# Patient Record
Sex: Female | Born: 1995 | Race: Black or African American | Hispanic: No | Marital: Single | Smoking: Never smoker
Health system: Southern US, Community
[De-identification: ages and names within clinical notes are randomized; demographics above are authoritative.]

## PROBLEM LIST (undated history)

## (undated) DIAGNOSIS — Z87448 Personal history of other diseases of urinary system: Secondary | ICD-10-CM

## (undated) DIAGNOSIS — D649 Anemia, unspecified: Secondary | ICD-10-CM

## (undated) HISTORY — PX: CHOLECYSTECTOMY: SHX55

---
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
## 2015-04-01 ENCOUNTER — Encounter (HOSPITAL_COMMUNITY): Payer: Self-pay | Admitting: Emergency Medicine

## 2015-04-01 ENCOUNTER — Emergency Department (HOSPITAL_COMMUNITY)
Admission: EM | Admit: 2015-04-01 | Discharge: 2015-04-02 | Disposition: A | Discharge status: Home / Self Care | Payer: Medicaid Other | Attending: Emergency Medicine | Admitting: Emergency Medicine

## 2015-04-01 DIAGNOSIS — R339 Retention of urine, unspecified: Secondary | ICD-10-CM

## 2015-04-01 DIAGNOSIS — Z791 Long term (current) use of non-steroidal anti-inflammatories (NSAID): Secondary | ICD-10-CM | POA: Diagnosis not present

## 2015-04-01 DIAGNOSIS — N3000 Acute cystitis without hematuria: Secondary | ICD-10-CM | POA: Insufficient documentation

## 2015-04-01 DIAGNOSIS — Z3202 Encounter for pregnancy test, result negative: Secondary | ICD-10-CM | POA: Insufficient documentation

## 2015-04-01 HISTORY — DX: Personal history of other diseases of urinary system: Z87.448

## 2015-04-01 LAB — URINALYSIS, ROUTINE W REFLEX MICROSCOPIC
Bilirubin Urine: NEGATIVE
Glucose, UA: NEGATIVE mg/dL
KETONES UR: NEGATIVE mg/dL
NITRITE: POSITIVE — AB
PH: 6.5 (ref 5.0–8.0)
Protein, ur: 100 mg/dL — AB
Specific Gravity, Urine: 1.027 (ref 1.005–1.030)
Urobilinogen, UA: 1 mg/dL (ref 0.0–1.0)

## 2015-04-01 LAB — CBC WITH DIFFERENTIAL/PLATELET
BASOS ABS: 0 10*3/uL (ref 0.0–0.1)
Basophils Relative: 0 % (ref 0–1)
EOS ABS: 0.3 10*3/uL (ref 0.0–0.7)
EOS PCT: 2 % (ref 0–5)
HCT: 37.7 % (ref 36.0–46.0)
HEMOGLOBIN: 11.8 g/dL — AB (ref 12.0–15.0)
LYMPHS ABS: 2.4 10*3/uL (ref 0.7–4.0)
Lymphocytes Relative: 20 % (ref 12–46)
MCH: 25.5 pg — AB (ref 26.0–34.0)
MCHC: 31.3 g/dL (ref 30.0–36.0)
MCV: 81.6 fL (ref 78.0–100.0)
MONO ABS: 0.6 10*3/uL (ref 0.1–1.0)
Monocytes Relative: 4 % (ref 3–12)
Neutro Abs: 9.2 10*3/uL — ABNORMAL HIGH (ref 1.7–7.7)
Neutrophils Relative %: 74 % (ref 43–77)
PLATELETS: 298 10*3/uL (ref 150–400)
RBC: 4.62 MIL/uL (ref 3.87–5.11)
RDW: 15.3 % (ref 11.5–15.5)
WBC: 12.5 10*3/uL — ABNORMAL HIGH (ref 4.0–10.5)

## 2015-04-01 LAB — BASIC METABOLIC PANEL
Anion gap: 9 (ref 5–15)
BUN: 6 mg/dL (ref 6–23)
CALCIUM: 9.6 mg/dL (ref 8.4–10.5)
CHLORIDE: 107 mmol/L (ref 96–112)
CO2: 25 mmol/L (ref 19–32)
CREATININE: 0.75 mg/dL (ref 0.50–1.10)
GFR calc Af Amer: >90 mL/min (ref >90)
Glucose, Bld: 105 mg/dL — ABNORMAL HIGH (ref 70–99)
Potassium: 3.7 mmol/L (ref 3.5–5.1)
SODIUM: 141 mmol/L (ref 135–145)

## 2015-04-01 LAB — URINE MICROSCOPIC-ADD ON

## 2015-04-01 LAB — PREGNANCY, URINE: Preg Test, Ur: NEGATIVE

## 2015-04-01 MED ORDER — KETOROLAC TROMETHAMINE 60 MG/2ML IM SOLN
60.0000 mg | Freq: Once | INTRAMUSCULAR | Status: AC
  Administered 2015-04-01: 60 mg via INTRAMUSCULAR
  Filled 2015-04-01: qty 2

## 2015-04-01 MED ORDER — OXYCODONE-ACETAMINOPHEN 5-325 MG PO TABS
1.0000 | ORAL_TABLET | Freq: Once | ORAL | Status: AC
  Administered 2015-04-01: 1 via ORAL
  Filled 2015-04-01: qty 1

## 2015-04-01 MED ORDER — CEFTRIAXONE SODIUM 1 G IJ SOLR
1.0000 g | INTRAMUSCULAR | Status: DC
  Administered 2015-04-01: 1 g via INTRAMUSCULAR
  Filled 2015-04-01: qty 10

## 2015-04-01 MED ORDER — ONDANSETRON HCL 4 MG PO TABS: 4.0000 mg | ORAL_TABLET | Freq: Four times a day (QID) | ORAL | Status: DC

## 2015-04-01 MED ORDER — ONDANSETRON 4 MG PO TBDP
4.0000 mg | ORAL_TABLET | Freq: Once | ORAL | Status: AC
  Administered 2015-04-01: 4 mg via ORAL
  Filled 2015-04-01: qty 1

## 2015-04-01 MED ORDER — LIDOCAINE HCL 1 % IJ SOLN
INTRAMUSCULAR | Status: AC
  Filled 2015-04-01: qty 20

## 2015-04-01 MED ORDER — CIPROFLOXACIN HCL 500 MG PO TABS: 500.0000 mg | ORAL_TABLET | Freq: Two times a day (BID) | ORAL | Status: DC

## 2015-04-01 NOTE — ED Notes (Signed)
Witnessed in and out catheter with NT Cassidy Kirk

## 2015-04-01 NOTE — ED Provider Notes (Signed)
CSN: [id number]     Arrival date & time 04/01/15  2013 History   First MD Initiated Contact with Patient 04/01/15 2113     Chief Complaint  Patient presents with  . urinary problems      (Consider location/radiation/quality/duration/timing/severity/associated sxs/prior Treatment) HPI    PCP: No primary care provider on file. Blood pressure 136/73, pulse 79, temperature 98.7 F (37.1 C), temperature source Oral, resp. rate 16, height  (1.702 m), weight 170 lb 4 oz (77.225 kg), last menstrual period 04/01/2015, SpO2 100 %.  Arliene Rosenow is a 19 y.o.female with a significant PMH of urethra dysfunction (unspecified) presents to the ER with complaints of being unable to urinate. Her dad who is here with her reports her having this problem as a child and a cystogram was done that showed her urinary sphincter did not function correctly. She required many in-and-out catheterizations but eventually grew out of it. Since yesterday she has developed urinary hesitancy and now is unable to urinate today except for very few drips. She is also having pain and suprapubic discomfort. She started her menstrual cycle today as well.  Negative Review of Symptoms: No nausea, vomiting, diarrhea, fevers, back pain, CP, SOB, LE swelling, hematuria, dysuria, injury to the abdomen or back.   Past Medical History  Diagnosis Date  . H/O bladder problems    History reviewed. No pertinent past surgical history. Family History  Problem Relation Age of Onset  . Heart attack Father 19  . Sudden death Neg Hx    History  Substance Use Topics  . Smoking status: Never Smoker   . Smokeless tobacco: Not on file  . Alcohol Use: No   OB History    No data available     Review of Systems  10 Systems reviewed and are negative for acute change except as noted in the HPI.   Allergies  Review of patient's allergies indicates no known allergies.  Home Medications   Prior to Admission medications    Medication Sig Start Date End Date Taking? Authorizing Provider  ibuprofen (ADVIL,MOTRIN) 800 MG tablet Take 1 tablet (800 mg total) by mouth 3 (three) times daily. 06/05/13  Yes Glynn Octave, MD   BP 112/72 mmHg  Pulse 96  Temp(Src) 98.2 F (36.8 C) (Oral)  Resp 18  Ht  (1.702 m)  Wt 170 lb 4 oz (77.225 kg)  BMI 26.66 kg/m2  SpO2 100%  LMP 04/01/2015 (Exact Date) Physical Exam  Constitutional: She appears well-developed and well-nourished. No distress.  HENT:  Head: Normocephalic and atraumatic.  Eyes: Pupils are equal, round, and reactive to light.  Neck: Normal range of motion. Neck supple.  Cardiovascular: Normal rate and regular rhythm.   Pulmonary/Chest: Effort normal.  Abdominal: Soft. Bowel sounds are normal. She exhibits no distension. There is tenderness in the suprapubic area. There is no rigidity, no rebound, no guarding and no CVA tenderness.  Neurological: She is alert.  Skin: Skin is warm and dry.  Nursing note and vitals reviewed.     ED Course  Procedures (including critical care time) Labs Review Labs Reviewed  URINALYSIS, ROUTINE W REFLEX MICROSCOPIC - Abnormal; Notable for the following:    Color, Urine AMBER (*)    APPearance TURBID (*)    Hgb urine dipstick LARGE (*)    Protein, ur 100 (*)    Nitrite POSITIVE (*)    Leukocytes, UA LARGE (*)    All other components within normal limits  CBC WITH DIFFERENTIAL/PLATELET -  Abnormal; Notable for the following:    WBC 12.5 (*)    Hemoglobin 11.8 (*)    MCH 25.5 (*)    Neutro Abs 9.2 (*)    All other components within normal limits  BASIC METABOLIC PANEL - Abnormal; Notable for the following:    Glucose, Bld 105 (*)    All other components within normal limits  URINE MICROSCOPIC-ADD ON - Abnormal; Notable for the following:    Bacteria, UA MANY (*)    All other components within normal limits  PREGNANCY, URINE    Imaging Review No results found.   EKG Interpretation None      MDM    Final diagnoses:  Acute cystitis without hematuria  Urinary retention    Patient has large UTI, discussed PO, IM vs IV medications for treatment. She preferred to have IM abx and antiinflammatory medications. Patient had 60 ml in her bladder and this was removed by in-out cath. Discussed with patient that the medications should help to decrease inflammation and increase flow, however, that she may end up needing to come back tomorrow to be cath'd until more inflammation resolved. Will also give referral to Urology. Now that he bladder has been drained she does not have any more discomfort.  CBC and BMP are reassuring.  19 y.o.Jamea Niebla's evaluation in the Emergency Department is complete. It has been determined that no acute conditions requiring further emergency intervention are present at this time. The patient/guardian have been advised of the diagnosis and plan. We have discussed signs and symptoms that warrant return to the ED, such as changes or worsening in symptoms.  Vital signs are stable at discharge. Filed Vitals:   04/01/15 2312  BP: 112/72  Pulse: 96  Temp: 98.2 F (36.8 C)  Resp: 18    Patient/guardian has voiced understanding and agreed to follow-up with the PCP or specialist.     Marlon Peliffany Shawnie Nicole, PA-C 04/01/15 [id number]  Mancel BaleElliott Wentz, MD 04/03/15 1402

## 2015-04-01 NOTE — Discharge Instructions (Signed)
Urinary Tract Infection Urinary tract infections (UTIs) can develop anywhere along your urinary tract. Your urinary tract is your body's drainage system for removing wastes and extra water. Your urinary tract includes two kidneys, two ureters, a bladder, and a urethra. Your kidneys are a pair of bean-shaped organs. Each kidney is about the size of your fist. They are located below your ribs, one on each side of your spine. CAUSES Infections are caused by microbes, which are microscopic organisms, including fungi, viruses, and bacteria. These organisms are so small that they can only be seen through a microscope. Bacteria are the microbes that most commonly cause UTIs. SYMPTOMS  Symptoms of UTIs may vary by age and gender of the patient and by the location of the infection. Symptoms in young women typically include a frequent and intense urge to urinate and a painful, burning feeling in the bladder or urethra during urination. Older women and men are more likely to be tired, shaky, and weak and have muscle aches and abdominal pain. A fever may mean the infection is in your kidneys. Other symptoms of a kidney infection include pain in your back or sides below the ribs, nausea, and vomiting. DIAGNOSIS To diagnose a UTI, your caregiver will ask you about your symptoms. Your caregiver also will ask to provide a urine sample. The urine sample will be tested for bacteria and white blood cells. White blood cells are made by your body to help fight infection. TREATMENT  Typically, UTIs can be treated with medication. Because most UTIs are caused by a bacterial infection, they usually can be treated with the use of antibiotics. The choice of antibiotic and length of treatment depend on your symptoms and the type of bacteria causing your infection. HOME CARE INSTRUCTIONS  If you were prescribed antibiotics, take them exactly as your caregiver instructs you. Finish the medication even if you feel better after you  have only taken some of the medication.  Drink enough water and fluids to keep your urine clear or pale yellow.  Avoid caffeine, tea, and carbonated beverages. They tend to irritate your bladder.  Empty your bladder often. Avoid holding urine for long periods of time.  Empty your bladder before and after sexual intercourse.  After a bowel movement, women should cleanse from front to back. Use each tissue only once. SEEK MEDICAL CARE IF:   You have back pain.  You develop a fever.  Your symptoms do not begin to resolve within 3 days. SEEK IMMEDIATE MEDICAL CARE IF:   You have severe back pain or lower abdominal pain.  You develop chills.  You have nausea or vomiting.  You have continued burning or discomfort with urination. MAKE SURE YOU:   Understand these instructions.  Will watch your condition.  Will get help right away if you are not doing well or get worse. Document Released: 09/01/2005 Document Revised: 05/23/2012 Document Reviewed: 12/31/2011 [facility] Patient Information 2015 [place], [place]. This information is not intended to replace advice given to you by your health care provider. Make sure you discuss any questions you have with your health care provider.  Acute Urinary Retention Acute urinary retention is the temporary inability to urinate. This is an uncommon problem in women. It can be caused by:  Infection.  A side effect of a medicine.  A problem in a nearby organ that presses or squeezes on the bladder or the urethra (the tube that drains the bladder).  Psychological problems.   Surgery on your bladder, urethra,  or pelvic organs that causes obstruction to the outflow of urine from your bladder. HOME CARE INSTRUCTIONS  If you are sent home with a Foley catheter and a drainage system, you will need to discuss the best course of action with your health care provider. While the catheter is in, maintain a good intake of fluids. Keep the drainage bag  emptied and lower than your catheter. This is so that contaminated urine will not flow back into your bladder, which could lead to a urinary tract infection. There are two main types of drainage bags. One is a large bag that usually is used at night. It has a good capacity that will allow you to sleep through the night without having to empty it. The second type is called a leg bag. It has a smaller capacity so it needs to be emptied more frequently. However, the main advantage is that it can be attached by a leg strap and goes underneath your clothing, allowing you the freedom to move about or leave your home. Only take over-the-counter or prescription medicines for pain, discomfort, or fever as directed by your health care provider.  SEEK MEDICAL CARE IF:  You develop a low-grade fever.  You experience spasms or leakage of urine with the spasms. SEEK IMMEDIATE MEDICAL CARE IF:   You develop chills or fever.  Your catheter stops draining urine.  Your catheter falls out.  You start to develop increased bleeding that does not respond to rest and increased fluid intake. MAKE SURE YOU:  Understand these instructions.  Will watch your condition.  Will get help right away if you are not doing well or get worse. Document Released: 11/21/2006 Document Revised: 09/12/2013 Document Reviewed: 05/03/2013 [facility] Patient Information 2015 [place], [place]. This information is not intended to replace advice given to you by your health care provider. Make sure you discuss any questions you have with your health care provider.

## 2015-04-01 NOTE — ED Notes (Signed)
Pt states for the past two days she has been having difficulty emptying her bladder  Pt states she is having to force herself to void  Pt states she had problems like this when she was younger and had to have a catheter  Pt states today she is completely unable to get anything to come out  Pt states she has a lot of pressure in her pelvic area  Pt states she started her period today as well

## 2015-04-04 LAB — URINE CULTURE

## 2015-04-09 ENCOUNTER — Telehealth (HOSPITAL_BASED_OUTPATIENT_CLINIC_OR_DEPARTMENT_OTHER): Payer: Self-pay | Admitting: Emergency Medicine

## 2015-04-09 NOTE — Telephone Encounter (Signed)
Post ED Visit - Positive Culture Follow-up  Culture report reviewed by antimicrobial stewardship pharmacist: []  Wes Dulaney, Pharm.D., BCPS [x]  Celedonio MiyamotoJeremy Frens, Pharm.D., BCPS []  Georgina PillionElizabeth Martin, Pharm.D., BCPS []  PomonaMinh Pham, [address]Pharm[address]D., BCPS, AAHIVP []  Estella HuskMichelle Turner, Pharm.D., BCPS, AAHIVP []  Elder CyphersLorie Poole, [address]Pharm.D., BCPS  Positive urine culture E. coli Treated with ciprofloxacin, organism sensitive to the same and no further patient follow-up is required at this time.  Berle MullMiller, Becky Colan 04/09/2015, 11:53 AM

## 2015-04-29 ENCOUNTER — Emergency Department (HOSPITAL_COMMUNITY)
Admission: EM | Admit: 2015-04-29 | Discharge: 2015-04-29 | Disposition: A | Discharge status: Home / Self Care | Payer: Medicaid Other | Attending: Emergency Medicine | Admitting: Emergency Medicine

## 2015-04-29 ENCOUNTER — Encounter (HOSPITAL_COMMUNITY): Payer: Self-pay | Admitting: Emergency Medicine

## 2015-04-29 DIAGNOSIS — Z791 Long term (current) use of non-steroidal anti-inflammatories (NSAID): Secondary | ICD-10-CM | POA: Insufficient documentation

## 2015-04-29 DIAGNOSIS — Z3202 Encounter for pregnancy test, result negative: Secondary | ICD-10-CM | POA: Insufficient documentation

## 2015-04-29 DIAGNOSIS — N946 Dysmenorrhea, unspecified: Secondary | ICD-10-CM | POA: Insufficient documentation

## 2015-04-29 DIAGNOSIS — Z792 Long term (current) use of antibiotics: Secondary | ICD-10-CM | POA: Insufficient documentation

## 2015-04-29 LAB — I-STAT CHEM 8, ED
BUN: 5 mg/dL — AB (ref 6–20)
CREATININE: 0.7 mg/dL (ref 0.44–1.00)
Calcium, Ion: 1.25 mmol/L — ABNORMAL HIGH (ref 1.12–1.23)
Chloride: 105 mmol/L (ref 101–111)
GLUCOSE: 94 mg/dL (ref 65–99)
HCT: 40 % (ref 36.0–46.0)
Hemoglobin: 13.6 g/dL (ref 12.0–15.0)
POTASSIUM: 3.7 mmol/L (ref 3.5–5.1)
Sodium: 140 mmol/L (ref 135–145)
TCO2: 20 mmol/L (ref 0–100)

## 2015-04-29 LAB — URINALYSIS, ROUTINE W REFLEX MICROSCOPIC
BILIRUBIN URINE: NEGATIVE
GLUCOSE, UA: NEGATIVE mg/dL
KETONES UR: NEGATIVE mg/dL
Nitrite: NEGATIVE
Protein, ur: NEGATIVE mg/dL
Specific Gravity, Urine: 1.023 (ref 1.005–1.030)
UROBILINOGEN UA: 1 mg/dL (ref 0.0–1.0)
pH: 7.5 (ref 5.0–8.0)

## 2015-04-29 LAB — CBC WITH DIFFERENTIAL/PLATELET
BASOS ABS: 0 10*3/uL (ref 0.0–0.1)
BASOS PCT: 0 % (ref 0–1)
EOS ABS: 0.2 10*3/uL (ref 0.0–0.7)
Eosinophils Relative: 2 % (ref 0–5)
HEMATOCRIT: 38.1 % (ref 36.0–46.0)
Hemoglobin: 12 g/dL (ref 12.0–15.0)
LYMPHS ABS: 1.7 10*3/uL (ref 0.7–4.0)
LYMPHS PCT: 14 % (ref 12–46)
MCH: 25.7 pg — AB (ref 26.0–34.0)
MCHC: 31.5 g/dL (ref 30.0–36.0)
MCV: 81.6 fL (ref 78.0–100.0)
MONOS PCT: 4 % (ref 3–12)
Monocytes Absolute: 0.6 10*3/uL (ref 0.1–1.0)
NEUTROS PCT: 80 % — AB (ref 43–77)
Neutro Abs: 10 10*3/uL — ABNORMAL HIGH (ref 1.7–7.7)
Platelets: 299 10*3/uL (ref 150–400)
RBC: 4.67 MIL/uL (ref 3.87–5.11)
RDW: 15.4 % (ref 11.5–15.5)
WBC: 12.5 10*3/uL — AB (ref 4.0–10.5)

## 2015-04-29 LAB — URINE MICROSCOPIC-ADD ON

## 2015-04-29 LAB — WET PREP, GENITAL
TRICH WET PREP: NONE SEEN
Yeast Wet Prep HPF POC: NONE SEEN

## 2015-04-29 LAB — PREGNANCY, URINE: Preg Test, Ur: NEGATIVE

## 2015-04-29 MED ORDER — SODIUM CHLORIDE 0.9 % IV BOLUS (SEPSIS)
1000.0000 mL | Freq: Once | INTRAVENOUS | Status: AC
  Administered 2015-04-29: 1000 mL via INTRAVENOUS

## 2015-04-29 MED ORDER — NAPROXEN 500 MG PO TABS: 500.0000 mg | ORAL_TABLET | Freq: Two times a day (BID) | ORAL | Status: DC

## 2015-04-29 MED ORDER — ONDANSETRON HCL 4 MG/2ML IJ SOLN
4.0000 mg | Freq: Once | INTRAMUSCULAR | Status: AC
  Administered 2015-04-29: 4 mg via INTRAVENOUS
  Filled 2015-04-29: qty 2

## 2015-04-29 NOTE — ED Notes (Addendum)
Per EMS. Pt stopped birth control 2 weeks ago. Pt was formerly on birth control for irregular periods with heavy bleeding. Pt reports heavy vaginal bleeding, lower back pain and lightheadedness. Has tried ibuprofen and midol with no relief. Pt ambulatory to room. Bleeding started this am along with emesis.

## 2015-04-29 NOTE — Progress Notes (Signed)
pcp is [facility] [address] [place], [place] [id number] [phone]  

## 2015-04-29 NOTE — ED Provider Notes (Signed)
CSN: [id number]     Arrival date & time 04/29/15  1202 History   First MD Initiated Contact with Patient 04/29/15 1207     Chief Complaint  Patient presents with  . Dysmenorrhea     (Consider location/radiation/quality/duration/timing/severity/associated sxs/prior Treatment) HPI Comments: Pt comes in today with menstrual cramping, heavy bleeding and dizziness. Pt states that she has similar history of heavy bleeding and she was on birth control for that and she stopped taking that 2 weeks ago. She tried ibuprofen with mild relief. Has nausea and diarrhea which pt states is also consistent with her menstrual cycle  The history is provided by the patient. No language interpreter was used.    Past Medical History  Diagnosis Date  . H/O bladder problems    History reviewed. No pertinent past surgical history. Family History  Problem Relation Age of Onset  . Heart attack Father 4027  . Sudden death Neg Hx    History  Substance Use Topics  . Smoking status: Never Smoker   . Smokeless tobacco: Not on file  . Alcohol Use: No   OB History    No data available     Review of Systems  All other systems reviewed and are negative.     Allergies  Review of patient's allergies indicates no known allergies.  Home Medications   Prior to Admission medications   Medication Sig Start Date End Date Taking? Authorizing Provider  ibuprofen (ADVIL,MOTRIN) 200 MG tablet Take 800 mg by mouth every 6 (six) hours as needed for moderate pain or cramping.   Yes Historical Provider, MD  ciprofloxacin (CIPRO) 500 MG tablet Take 1 tablet (500 mg total) by mouth 2 (two) times daily. Patient not taking: Reported on 04/29/2015 04/01/15   Marlon Peliffany Greene, PA-C  ibuprofen (ADVIL,MOTRIN) 800 MG tablet Take 1 tablet (800 mg total) by mouth 3 (three) times daily. Patient not taking: Reported on 04/29/2015 06/05/13   Glynn OctaveStephen Rancour, MD  ondansetron (ZOFRAN) 4 MG tablet Take 1 tablet (4 mg total) by mouth every 6  (six) hours. Patient not taking: Reported on 04/29/2015 04/01/15   Marlon Peliffany Greene, PA-C   BP 114/66 mmHg  Pulse 55  Temp(Src) 98.1 F (36.7 C) (Oral)  Resp 19  SpO2 100%  LMP 04/01/2015 (Exact Date) Physical Exam  Constitutional: She is oriented to person, place, and time. She appears well-developed and well-nourished.  Cardiovascular: Normal rate and regular rhythm.   Pulmonary/Chest: Effort normal and breath sounds normal.  Abdominal: Soft. Bowel sounds are normal. There is no tenderness.  Genitourinary:  -cmt. No adnexal tenderness. Mild vaginal bleeding  Musculoskeletal: Normal range of motion.  Neurological: She is alert and oriented to person, place, and time.  Skin: Skin is warm and dry.  Psychiatric: She has a normal mood and affect.  Nursing note and vitals reviewed.   ED Course  Procedures (including critical care time) Labs Review Labs Reviewed  WET PREP, GENITAL - Abnormal; Notable for the following:    Clue Cells Wet Prep HPF POC FEW (*)    WBC, Wet Prep HPF POC MODERATE (*)    All other components within normal limits  URINALYSIS, ROUTINE W REFLEX MICROSCOPIC - Abnormal; Notable for the following:    APPearance TURBID (*)    Hgb urine dipstick LARGE (*)    Leukocytes, UA TRACE (*)    All other components within normal limits  CBC WITH DIFFERENTIAL/PLATELET - Abnormal; Notable for the following:    WBC 12.5 (*)  MCH 25.7 (*)    Neutrophils Relative % 80 (*)    Neutro Abs 10.0 (*)    All other components within normal limits  URINE MICROSCOPIC-ADD ON - Abnormal; Notable for the following:    Squamous Epithelial / LPF FEW (*)    Bacteria, UA FEW (*)    All other components within normal limits  I-STAT CHEM 8, ED - Abnormal; Notable for the following:    BUN 5 (*)    Calcium, Ion 1.25 (*)    All other components within normal limits  PREGNANCY, URINE  HIV ANTIBODY (ROUTINE TESTING)  GC/CHLAMYDIA PROBE AMP ([place])    Imaging Review No results  found.   EKG Interpretation None      MDM   Final diagnoses:  Dysmenorrhea  Menstrual cramps    hgb normal. Pt feeling better after some fluids. Will send home with naproxen. Discussed with pt that starting birth control again is a good idea. She plans to do that again this week    Teressa Lower, NP 04/29/15 1523  Benjiman Core, MD 04/29/15 [phone]

## 2015-04-29 NOTE — Discharge Instructions (Signed)

## 2015-04-29 NOTE — ED Notes (Signed)
Bed: [id number] Expected date:  Expected time:  Means of arrival:  Comments: EMS- 18yo F, menstrual cramps

## 2015-04-30 LAB — HIV ANTIBODY (ROUTINE TESTING W REFLEX): HIV Screen 4th Generation wRfx: NONREACTIVE

## 2015-04-30 LAB — GC/CHLAMYDIA PROBE AMP (~~LOC~~) NOT AT ARMC
Chlamydia: NEGATIVE
Neisseria Gonorrhea: NEGATIVE

## 2015-12-06 ENCOUNTER — Emergency Department (HOSPITAL_COMMUNITY)
Admission: EM | Admit: 2015-12-06 | Discharge: 2015-12-06 | Disposition: A | Discharge status: Home / Self Care | Payer: Medicaid Other | Attending: Emergency Medicine | Admitting: Emergency Medicine

## 2015-12-06 ENCOUNTER — Encounter (HOSPITAL_COMMUNITY): Payer: Self-pay | Admitting: *Deleted

## 2015-12-06 ENCOUNTER — Emergency Department (HOSPITAL_COMMUNITY): Payer: Medicaid Other

## 2015-12-06 DIAGNOSIS — Z862 Personal history of diseases of the blood and blood-forming organs and certain disorders involving the immune mechanism: Secondary | ICD-10-CM | POA: Diagnosis not present

## 2015-12-06 DIAGNOSIS — N72 Inflammatory disease of cervix uteri: Secondary | ICD-10-CM | POA: Diagnosis not present

## 2015-12-06 DIAGNOSIS — Z3202 Encounter for pregnancy test, result negative: Secondary | ICD-10-CM | POA: Diagnosis not present

## 2015-12-06 DIAGNOSIS — R102 Pelvic and perineal pain: Secondary | ICD-10-CM

## 2015-12-06 HISTORY — DX: Anemia, unspecified: D64.9

## 2015-12-06 LAB — URINALYSIS, ROUTINE W REFLEX MICROSCOPIC
BILIRUBIN URINE: NEGATIVE
GLUCOSE, UA: NEGATIVE mg/dL
Hgb urine dipstick: NEGATIVE
KETONES UR: NEGATIVE mg/dL
Nitrite: NEGATIVE
PROTEIN: NEGATIVE mg/dL
Specific Gravity, Urine: 1.02 (ref 1.005–1.030)
pH: 7.5 (ref 5.0–8.0)

## 2015-12-06 LAB — URINE MICROSCOPIC-ADD ON

## 2015-12-06 LAB — WET PREP, GENITAL
Clue Cells Wet Prep HPF POC: NONE SEEN
Sperm: NONE SEEN
Trich, Wet Prep: NONE SEEN
Yeast Wet Prep HPF POC: NONE SEEN

## 2015-12-06 LAB — POC URINE PREG, ED: PREG TEST UR: NEGATIVE

## 2015-12-06 MED ORDER — DEXTROSE 5 % IV SOLN
1.0000 g | Freq: Once | INTRAVENOUS | Status: AC
  Administered 2015-12-06: 1 g via INTRAVENOUS
  Filled 2015-12-06: qty 10

## 2015-12-06 MED ORDER — HYDROCODONE-ACETAMINOPHEN 5-325 MG PO TABS: 2.0000 | ORAL_TABLET | ORAL | Status: DC | PRN

## 2015-12-06 MED ORDER — IBUPROFEN 600 MG PO TABS: 600.0000 mg | ORAL_TABLET | Freq: Four times a day (QID) | ORAL | Status: DC | PRN

## 2015-12-06 MED ORDER — DOXYCYCLINE HYCLATE 100 MG PO CAPS: 100.0000 mg | ORAL_CAPSULE | Freq: Two times a day (BID) | ORAL | Status: DC

## 2015-12-06 MED ORDER — HYDROCODONE-ACETAMINOPHEN 5-325 MG PO TABS
2.0000 | ORAL_TABLET | Freq: Once | ORAL | Status: AC
  Administered 2015-12-06: 2 via ORAL
  Filled 2015-12-06: qty 2

## 2015-12-06 NOTE — ED Provider Notes (Signed)
CSN: [id number]     Arrival date & time 12/06/15  1528 History   First MD Initiated Contact with Patient 12/06/15 1718     Chief Complaint  Patient presents with  . Vaginal Pain     (Consider location/radiation/quality/duration/timing/severity/associated sxs/prior Treatment) HPI Patient presents with one week of vaginal swelling and irritation. She also admits to dysuria. She states she had protected sex on 12/24. Symptoms began at that point. Denies any nausea or vomiting. No fever or chills. Denies vaginal discharge or bleeding.  Past Medical History  Diagnosis Date  . H/O bladder problems   . Anemia    History reviewed. No pertinent past surgical history. Family History  Problem Relation Age of Onset  . Heart attack Father 62  . Sudden death Neg Hx    Social History  Substance Use Topics  . Smoking status: Never Smoker   . Smokeless tobacco: None  . Alcohol Use: Yes     Comment: occ   OB History    No data available     Review of Systems  Constitutional: Negative for fever and chills.  Respiratory: Negative for shortness of breath.   Cardiovascular: Negative for chest pain.  Gastrointestinal: Negative for nausea, vomiting, abdominal pain and diarrhea.  Genitourinary: Positive for dysuria and vaginal pain. Negative for frequency, flank pain, vaginal bleeding, vaginal discharge and difficulty urinating.  Musculoskeletal: Negative for myalgias, back pain and neck pain.  Skin: Negative for rash and wound.  Neurological: Negative for dizziness, weakness, light-headedness, numbness and headaches.  All other systems reviewed and are negative.     Allergies  Review of patient's allergies indicates no known allergies.  Home Medications   Prior to Admission medications   Medication Sig Start Date End Date Taking? Authorizing Provider  ciprofloxacin (CIPRO) 500 MG tablet Take 1 tablet (500 mg total) by mouth 2 (two) times daily. Patient not taking: Reported on  04/29/2015 04/01/15   Marlon Pel, PA-C  doxycycline (VIBRAMYCIN) 100 MG capsule Take 1 capsule (100 mg total) by mouth 2 (two) times daily. One po bid x 7 days 12/06/15   Loren Racer, MD  HYDROcodone-acetaminophen [facility]) 5-325 MG tablet Take 2 tablets by mouth every 4 (four) hours as needed. 12/06/15   Loren Racer, MD  ibuprofen (ADVIL,MOTRIN) 600 MG tablet Take 1 tablet (600 mg total) by mouth every 6 (six) hours as needed. 12/06/15   Loren Racer, MD  naproxen (NAPROSYN) 500 MG tablet Take 1 tablet (500 mg total) by mouth 2 (two) times daily. Patient not taking: Reported on 12/06/2015 04/29/15   Teressa Lower, NP  ondansetron (ZOFRAN) 4 MG tablet Take 1 tablet (4 mg total) by mouth every 6 (six) hours. Patient not taking: Reported on 04/29/2015 04/01/15   Marlon Pel, PA-C   BP 110/64 mmHg  Pulse 62  Temp(Src) 98.2 F (36.8 C) (Oral)  Resp 16  Ht  (1.702 m)  Wt 170 lb (77.111 kg)  BMI 26.62 kg/m2  SpO2 100%  LMP 11/19/2015 Physical Exam  Constitutional: She is oriented to person, place, and time. She appears well-developed and well-nourished. No distress.  HENT:  Head: Normocephalic and atraumatic.  Mouth/Throat: Oropharynx is clear and moist.  Eyes: EOM are normal. Pupils are equal, round, and reactive to light.  Neck: Normal range of motion. Neck supple.  Cardiovascular: Normal rate and regular rhythm.   Pulmonary/Chest: Effort normal and breath sounds normal. No respiratory distress. She has no wheezes. She has no rales.  Abdominal: Soft. Bowel sounds  are normal. She exhibits no distension and no mass. There is no tenderness. There is no rebound and no guarding.  Genitourinary: Vaginal discharge found.  Painful speculum exam. Copious Amounts of Purulent Discharge. External Genitalia Appears Normal. No Masses Appreciated. Unable to do a bimanual exam due to discomfort.  Musculoskeletal: Normal range of motion. She exhibits no edema or tenderness.  No CVA  tenderness bilaterally.  Neurological: She is alert and oriented to person, place, and time.  Moves all extremities without deficit. Sensation grossly intact.  Skin: Skin is warm and dry. No rash noted. No erythema.  Psychiatric: She has a normal mood and affect. Her behavior is normal.  Nursing note and vitals reviewed.   ED Course  Procedures (including critical care time) Labs Review Labs Reviewed  WET PREP, GENITAL - Abnormal; Notable for the following:    WBC, Wet Prep HPF POC MANY (*)    All other components within normal limits  URINALYSIS, ROUTINE W REFLEX MICROSCOPIC (NOT AT [facility]) - Abnormal; Notable for the following:    APPearance CLOUDY (*)    Leukocytes, UA MODERATE (*)    All other components within normal limits  URINE MICROSCOPIC-ADD ON - Abnormal; Notable for the following:    Squamous Epithelial / LPF 6-30 (*)    Bacteria, UA FEW (*)    All other components within normal limits  POC URINE PREG, ED  GC/CHLAMYDIA PROBE AMP ([place]) NOT AT [facility]    Imaging Review [place] Transvaginal Non-ob  12/06/2015  CLINICAL DATA:  Patient with vaginal swelling and discharge. EXAM: TRANSABDOMINAL AND TRANSVAGINAL ULTRASOUND OF PELVIS TECHNIQUE: Both transabdominal and transvaginal ultrasound examinations of the pelvis were performed. Transabdominal technique was performed for global imaging of the pelvis including uterus, ovaries, adnexal regions, and pelvic cul-de-sac. It was necessary to proceed with endovaginal exam following the transabdominal exam to visualize the adnexal structures. COMPARISON:  None FINDINGS: Uterus Measurements: 7.3 x 3.6 x 4.8 cm. Retroverted. No fibroids or other mass visualized. Endometrium Thickness: 15 mm.  No focal abnormality visualized. Right ovary Measurements: 6.1 x 2.3 x 3.0 cm. Normal appearance/no adnexal mass. Left ovary Measurements: 5.3 x 2.1 x 2.6 cm. Normal appearance/no adnexal mass. Other findings Small amount of free fluid in the pelvis.  IMPRESSION: Unremarkable pelvic ultrasound. Electronically Signed   By: Annia Beltrew  Davis M.D.   On: 12/06/2015 21:57   [place] Pelvis Complete  12/06/2015  CLINICAL DATA:  Patient with vaginal swelling and discharge. EXAM: TRANSABDOMINAL AND TRANSVAGINAL ULTRASOUND OF PELVIS TECHNIQUE: Both transabdominal and transvaginal ultrasound examinations of the pelvis were performed. Transabdominal technique was performed for global imaging of the pelvis including uterus, ovaries, adnexal regions, and pelvic cul-de-sac. It was necessary to proceed with endovaginal exam following the transabdominal exam to visualize the adnexal structures. COMPARISON:  None FINDINGS: Uterus Measurements: 7.3 x 3.6 x 4.8 cm. Retroverted. No fibroids or other mass visualized. Endometrium Thickness: 15 mm.  No focal abnormality visualized. Right ovary Measurements: 6.1 x 2.3 x 3.0 cm. Normal appearance/no adnexal mass. Left ovary Measurements: 5.3 x 2.1 x 2.6 cm. Normal appearance/no adnexal mass. Other findings Small amount of free fluid in the pelvis. IMPRESSION: Unremarkable pelvic ultrasound. Electronically Signed   By: Annia Beltrew  Davis M.D.   On: 12/06/2015 21:57   I have personally reviewed and evaluated these images and lab results as part of my medical decision-making.   EKG Interpretation None      MDM   Final diagnoses:  Cervicitis    Unable to  complete pelvic exam due to patient discomfort. Ultrasound obtained without any evidence of tubo-ovarian abscess. Given IV Rocephin in the emergency department and will discharge home with a course of doxycycline. Patient understands need to have all sexual partners evaluated and treated. She's been given follow-up with women's clinic. Return precautions given.    Loren Racer, MD 12/07/15 [phone]

## 2015-12-06 NOTE — Discharge Instructions (Signed)
Cervicitis Cervicitis is a soreness and swelling (inflammation) of the cervix. Your cervix is located at the bottom of your uterus. It opens up to the vagina. CAUSES   Sexually transmitted infections (STIs).   Allergic reaction.   Medicines or birth control devices that are put in the vagina.   Injury to the cervix.   Bacterial infections.  RISK FACTORS You are at greater risk if you:  Have unprotected sexual intercourse.  Have sexual intercourse with many partners.  Began sexual intercourse at an early age.  Have a history of STIs. SYMPTOMS  There may be no symptoms. If symptoms occur, they may include:   Gray, white, yellow, or bad-smelling vaginal discharge.   Pain or itching of the area outside the vagina.   Painful sexual intercourse.   Lower abdominal or lower back pain, especially during intercourse.   Frequent urination.   Abnormal vaginal bleeding between periods, after sexual intercourse, or after menopause.   Pressure or a heavy feeling in the pelvis.  DIAGNOSIS  Diagnosis is made after a pelvic exam. Other tests may include:   Examination of any discharge under a microscope (wet prep).   A Pap test.  TREATMENT  Treatment will depend on the cause of cervicitis. If it is caused by an STI, both you and your partner will need to be treated. Antibiotic medicines will be given.  HOME CARE INSTRUCTIONS   Do not have sexual intercourse until your health care provider says it is okay.   Do not have sexual intercourse until your partner has been treated, if your cervicitis is caused by an STI.   Take your antibiotics as directed. Finish them even if you start to feel better.  SEEK MEDICAL CARE IF:  Your symptoms come back.   You have a fever.  MAKE SURE YOU:   Understand these instructions.  Will watch your condition.  Will get help right away if you are not doing well or get worse.   This information is not intended to replace  advice given to you by your health care provider. Make sure you discuss any questions you have with your health care provider.   Document Released: 11/22/2005 Document Revised: 11/27/2013 Document Reviewed: 05/16/2013 Elsevier Interactive Patient Education [employer]  Sexually Transmitted Disease A sexually transmitted disease (STD) is a disease or infection that may be passed (transmitted) from person to person, usually during sexual activity. This may happen by way of saliva, semen, blood, vaginal mucus, or urine. Common STDs include:  Gonorrhea.  Chlamydia.  Syphilis.  HIV and AIDS.  Genital herpes.  Hepatitis B and C.  Trichomonas.  Human papillomavirus (HPV).  Pubic lice.  Scabies.  Mites.  Bacterial vaginosis. WHAT ARE CAUSES OF STDs? An STD may be caused by bacteria, a virus, or parasites. STDs are often transmitted during sexual activity if one person is infected. However, they may also be transmitted through nonsexual means. STDs may be transmitted after:   Sexual intercourse with an infected person.  Sharing sex toys with an infected person.  Sharing needles with an infected person or using unclean piercing or tattoo needles.  Having intimate contact with the genitals, mouth, or rectal areas of an infected person.  Exposure to infected fluids during birth. WHAT ARE THE SIGNS AND SYMPTOMS OF STDs? Different STDs have different symptoms. Some people may not have any symptoms. If symptoms are present, they may include:  Painful or bloody urination.  Pain in the pelvis, abdomen, vagina, anus, throat,  or eyes.  A skin rash, itching, or irritation.  Growths, ulcerations, blisters, or sores in the genital and anal areas.  Abnormal vaginal discharge with or without bad odor.  Penile discharge in men.  Fever.  Pain or bleeding during sexual intercourse.  Swollen glands in the groin area.  Yellow skin and eyes (jaundice). This is seen with  hepatitis.  Swollen testicles.  Infertility.  Sores and blisters in the mouth. HOW ARE STDs DIAGNOSED? To make a diagnosis, your health care provider may:  Take a medical history.  Perform a physical exam.  Take a sample of any discharge to examine.  Swab the throat, cervix, opening to the penis, rectum, or vagina for testing.  Test a sample of your first morning urine.  Perform blood tests.  Perform a Pap test, if this applies.  Perform a colposcopy.  Perform a laparoscopy. HOW ARE STDs TREATED? Treatment depends on the STD. Some STDs may be treated but not cured.  Chlamydia, gonorrhea, trichomonas, and syphilis can be cured with antibiotic medicine.  Genital herpes, hepatitis, and HIV can be treated, but not cured, with prescribed medicines. The medicines lessen symptoms.  Genital warts from HPV can be treated with medicine or by freezing, burning (electrocautery), or surgery. Warts may come back.  HPV cannot be cured with medicine or surgery. However, abnormal areas may be removed from the cervix, vagina, or vulva.  If your diagnosis is confirmed, your recent sexual partners need treatment. This is true even if they are symptom-free or have a negative culture or evaluation. They should not have sex until their health care providers say it is okay.  Your health care provider may test you for infection again 3 months after treatment. HOW CAN I REDUCE MY RISK OF GETTING AN STD? Take these steps to reduce your risk of getting an STD:  Use latex condoms, dental dams, and water-soluble lubricants during sexual activity. Do not use petroleum jelly or oils.  Avoid having multiple sex partners.  Do not have sex with someone who has other sex partners  Do not have sex with anyone you do not know or who is at high risk for an STD.  Avoid risky sex practices that can break your skin.  Do not have sex if you have open sores on your mouth or skin.  Avoid drinking too much  alcohol or taking illegal drugs. Alcohol and drugs can affect your judgment and put you in a vulnerable position.  Avoid engaging in oral and anal sex acts.  Get vaccinated for HPV and hepatitis. If you have not received these vaccines in the past, talk to your health care provider about whether one or both might be right for you.  If you are at risk of being infected with HIV, it is recommended that you take a prescription medicine daily to prevent HIV infection. This is called pre-exposure prophylaxis (PrEP). You are considered at risk if:  You are a man who has sex with other men (MSM).  You are a heterosexual man or woman and are sexually active with more than one partner.  You take drugs by injection.  You are sexually active with a partner who has HIV.  Talk with your health care provider about whether you are at high risk of being infected with HIV. If you choose to begin PrEP, you should first be tested for HIV. You should then be tested every 3 months for as long as you are taking PrEP. WHAT SHOULD I  DO IF I THINK I HAVE AN STD?  See your health care provider.  Tell your sexual partner(s). They should be tested and treated for any STDs.  Do not have sex until your health care provider says it is okay. WHEN SHOULD I GET IMMEDIATE MEDICAL CARE? Contact your health care provider right away if:   You have severe abdominal pain.  You are a man and notice swelling or pain in your testicles.  You are a woman and notice swelling or pain in your vagina.   This information is not intended to replace advice given to you by your health care provider. Make sure you discuss any questions you have with your health care provider.   Document Released: 02/12/2003 Document Revised: 12/13/2014 Document Reviewed: 06/12/2013 Elsevier Interactive Patient Education [employer]

## 2015-12-06 NOTE — ED Notes (Signed)
Patient transported to Ultrasound 

## 2015-12-06 NOTE — ED Notes (Signed)
Pt states swelling, redness and pain to L side of vagina and vulva x 1 week.  Denies vaginal discharge.  States pain with urination today.

## 2015-12-09 LAB — GC/CHLAMYDIA PROBE AMP (~~LOC~~) NOT AT ARMC
CHLAMYDIA, DNA PROBE: NEGATIVE
Neisseria Gonorrhea: NEGATIVE

## 2015-12-24 ENCOUNTER — Encounter: Payer: Medicaid Other | Admitting: Obstetrics and Gynecology

## 2016-05-19 ENCOUNTER — Emergency Department (HOSPITAL_BASED_OUTPATIENT_CLINIC_OR_DEPARTMENT_OTHER)
Admission: EM | Admit: 2016-05-19 | Discharge: 2016-05-19 | Disposition: A | Discharge status: Home / Self Care | Payer: Medicaid Other | Attending: Emergency Medicine | Admitting: Emergency Medicine

## 2016-05-19 ENCOUNTER — Encounter (HOSPITAL_BASED_OUTPATIENT_CLINIC_OR_DEPARTMENT_OTHER): Payer: Self-pay

## 2016-05-19 DIAGNOSIS — N760 Acute vaginitis: Secondary | ICD-10-CM | POA: Insufficient documentation

## 2016-05-19 DIAGNOSIS — N898 Other specified noninflammatory disorders of vagina: Secondary | ICD-10-CM | POA: Diagnosis present

## 2016-05-19 DIAGNOSIS — B9689 Other specified bacterial agents as the cause of diseases classified elsewhere: Secondary | ICD-10-CM

## 2016-05-19 LAB — URINALYSIS, ROUTINE W REFLEX MICROSCOPIC
BILIRUBIN URINE: NEGATIVE
GLUCOSE, UA: NEGATIVE mg/dL
HGB URINE DIPSTICK: NEGATIVE
Ketones, ur: NEGATIVE mg/dL
Leukocytes, UA: NEGATIVE
Nitrite: NEGATIVE
PROTEIN: NEGATIVE mg/dL
Specific Gravity, Urine: 1.019 (ref 1.005–1.030)
pH: 6 (ref 5.0–8.0)

## 2016-05-19 LAB — WET PREP, GENITAL
Sperm: NONE SEEN
Trich, Wet Prep: NONE SEEN
Yeast Wet Prep HPF POC: NONE SEEN

## 2016-05-19 LAB — PREGNANCY, URINE: PREG TEST UR: NEGATIVE

## 2016-05-19 MED ORDER — METRONIDAZOLE 500 MG PO TABS
500.0000 mg | ORAL_TABLET | Freq: Two times a day (BID) | ORAL | Status: DC
Start: 2016-05-19 — End: 2020-06-04

## 2016-05-19 MED ORDER — FLUCONAZOLE 150 MG PO TABS: 150.0000 mg | ORAL_TABLET | Freq: Every day | ORAL | Status: AC

## 2016-05-19 NOTE — ED Notes (Signed)
Pt states she has had these symptoms on and off since she was dx with BV in December of '16.

## 2016-05-19 NOTE — ED Provider Notes (Signed)
CSN: [id number]     Arrival date & time 05/19/16  1846 History  By signing my name below, I, Cassidy Kirk, attest that this documentation has been prepared under the direction and in the presence of Mattie Marlin, PA-C.   Electronically Signed: Rosario Kirk, ED Scribe. 05/19/2016. 7:56 PM.    Chief Complaint  Patient presents with  . Vaginal Discharge   The history is provided by the patient. No language interpreter was used.   HPI Comments: Cassidy Kirk is a 20 y.o. female with a PMHx of Anemia who presents to the Emergency Department complaining of gradual onset, gradually worsening white and dark brown vaginal discharge onset 4 days ago. Pt has associated vaginal itching. Pt reports that she tried using a feminine wipes with mild relief of her itching. She reports that she has a frequent hx of yeast infections and bacterial vaginosis that present every 2-3 months. Her itching is similar to hx of yeast infections and bacterial vaginosis. Pt reports that she Is not currently sexually active. LKMP was > two weeks ago. Pt denies vaginal odor, dysruria, frequency, hematuria, urgency, abdominal pain, fevers, or chills. Pt states she had STD testing since her last episode of intercourse so she is declining RPR and HIV testing.  Past Medical History  Diagnosis Date  . H/O bladder problems   . Anemia    History reviewed. No pertinent past surgical history. Family History  Problem Relation Age of Onset  . Heart attack Father 12  . Sudden death Neg Hx    Social History  Substance Use Topics  . Smoking status: Never Smoker   . Smokeless tobacco: None  . Alcohol Use: No   OB History    No data available     Review of Systems  Constitutional: Negative for fever and chills.  Respiratory: Negative for shortness of breath.   Cardiovascular: Negative for chest pain.  Gastrointestinal: Negative for abdominal pain.  Genitourinary: Positive for vaginal bleeding and vaginal  discharge. Negative for dysuria, urgency, frequency and hematuria.   Allergies  Review of patient's allergies indicates no known allergies.  Home Medications   Prior to Admission medications   Medication Sig Start Date End Date Taking? Authorizing Provider  fluconazole (DIFLUCAN) 150 MG tablet Take 1 tablet (150 mg total) by mouth daily. 05/19/16 05/26/16  Jerre Simon, PA  metroNIDAZOLE (FLAGYL) 500 MG tablet Take 1 tablet (500 mg total) by mouth 2 (two) times daily. 05/19/16   Shaquon Gropp L Javeah Loeza, PA   BP 126/79 mmHg  Pulse 79  Temp(Src) 99.3 F (37.4 C) (Oral)  Resp 18  Ht  (1.702 m)  Wt 187 lb (84.823 kg)  BMI 29.28 kg/m2  SpO2 100%  LMP 05/05/2016   Physical Exam  Constitutional: She appears well-developed and well-nourished.  HENT:  Head: Normocephalic.  Eyes: Conjunctivae are normal.  Cardiovascular: Normal rate, regular rhythm and normal heart sounds.  Exam reveals no gallop and no friction rub.   No murmur heard. Pulses:      Radial pulses are 2+ on the right side, and 2+ on the left side.       Dorsalis pedis pulses are 2+ on the right side, and 2+ on the left side.  Pulmonary/Chest: Effort normal and breath sounds normal. No respiratory distress. She has no wheezes. She has no rales.  Lungs CTA bilaterally.  Abdominal: Soft. Bowel sounds are normal. She exhibits no distension. There is tenderness (suprapubic). There is no rebound, no guarding  and no CVA tenderness.  Genitourinary:  Exam performed by Jerre SimonJessica L Hashem Goynes,  exam chaperoned Date: 05/20/2016 Pelvic exam: normal external genitalia without evidence of trauma. VULVA: normal appearing vulva with no masses, tenderness or lesion. VAGINA: normal appearing vagina with normal color and discharge, no lesions. CERVIX: normal appearing cervix without lesions, cervical motion tenderness absent, cervical os closed with out purulent discharge; vaginal discharge - mucoid, thick with tinges of blood, Wet prep and DNA  probe for chlamydia and GC obtained.   ADNEXA: normal adnexa in size, no tenderness on palpation and no masses UTERUS: uterus is normal size, shape, consistency and mild tenderness on palpation.    Musculoskeletal: Normal range of motion.  Neurological: She is alert.  Skin: Skin is warm and dry.  Psychiatric: She has a normal mood and affect. Her behavior is normal.  Nursing note and vitals reviewed.  Chaperone present throughout entire exam.   ED Course  Procedures (including critical care time)  DIAGNOSTIC STUDIES: Oxygen Saturation is 100% on RA, normal by my interpretation.   COORDINATION OF CARE: 7:50 PM-Discussed next steps with pt including a Pelvic exam. Pt verbalized understanding and is agreeable with the plan.   Labs Review Labs Reviewed  WET PREP, GENITAL - Abnormal; Notable for the following:    Clue Cells Wet Prep HPF POC PRESENT (*)    WBC, Wet Prep HPF POC MANY (*)    All other components within normal limits  PREGNANCY, URINE  URINALYSIS, ROUTINE W REFLEX MICROSCOPIC (NOT AT [facility])  GC/CHLAMYDIA PROBE AMP ([place]) NOT AT [facility]   I have personally reviewed and evaluated these lab results as part of my medical decision-making.  MDM   Final diagnoses:  BV (bacterial vaginosis)   Pt with vaginal itching and discomfort. Patient afebrile, VSS, well-appearing. Clue cells present on wet prep. Past history of recurrent bacterial vaginosis. Patient states her abdominal pain feels similar to the onset of her period. Her uterus was slightly tender on palpation likely 2/2 the onset of her menstrual period. Will treat patient with Flagyl and instructed her not to drink alcohol on this antibiotic. Also gave her prophylactic Diflucan. Instructed patient to follow up with her GYN within 1 week. Discussed strict return precautions with the patient. Patient's expressed understanding to the discharge instructions.  I personally performed the services described in this  documentation, which was scribed in my presence. The recorded information has been reviewed and is accurate.       Jerre SimonJessica L Omaree Fuqua, PA 05/20/16 0127  Arby BarretteMarcy Pfeiffer, MD 05/22/16 [id number]

## 2016-05-19 NOTE — ED Notes (Signed)
Pt states last time she had intercourse was before New Years, pt endorses taking baths with fragrant epsom salts. Pt denies douching.

## 2016-05-19 NOTE — ED Notes (Signed)
Vaginal d/c, itching x 2 days-bloody d/c x today-NAD-steady gait

## 2016-05-19 NOTE — Discharge Instructions (Signed)
Follow-up with your OB/GYN within 3-5 days to be reevaluated or sooner if symptoms persist.  Return to emergency department if you experience foul vaginal discharge, abdominal pain, fever, chills, nausea, vomiting, diarrhea.  Bacterial Vaginosis Bacterial vaginosis is a vaginal infection that occurs when the normal balance of bacteria in the vagina is disrupted. It results from an overgrowth of certain bacteria. This is the most common vaginal infection in women of childbearing age. Treatment is important to prevent complications, especially in pregnant women, as it can cause a premature delivery. CAUSES  Bacterial vaginosis is caused by an increase in harmful bacteria that are normally present in smaller amounts in the vagina. Several different kinds of bacteria can cause bacterial vaginosis. However, the reason that the condition develops is not fully understood. RISK FACTORS Certain activities or behaviors can put you at an increased risk of developing bacterial vaginosis, including:  Having a new sex partner or multiple sex partners.  Douching.  Using an intrauterine device (IUD) for contraception. Women do not get bacterial vaginosis from toilet seats, bedding, swimming pools, or contact with objects around them. SIGNS AND SYMPTOMS  Some women with bacterial vaginosis have no signs or symptoms. Common symptoms include:  Grey vaginal discharge.  A fishlike odor with discharge, especially after sexual intercourse.  Itching or burning of the vagina and vulva.  Burning or pain with urination. DIAGNOSIS  Your health care provider will take a medical history and examine the vagina for signs of bacterial vaginosis. A sample of vaginal fluid may be taken. Your health care provider will look at this sample under a microscope to check for bacteria and abnormal cells. A vaginal pH test may also be done.  TREATMENT  Bacterial vaginosis may be treated with antibiotic medicines. These may be  given in the form of a pill or a vaginal cream. A second round of antibiotics may be prescribed if the condition comes back after treatment. Because bacterial vaginosis increases your risk for sexually transmitted diseases, getting treated can help reduce your risk for chlamydia, gonorrhea, HIV, and herpes. HOME CARE INSTRUCTIONS   Only take over-the-counter or prescription medicines as directed by your health care provider.  If antibiotic medicine was prescribed, take it as directed. Make sure you finish it even if you start to feel better.  Tell all sexual partners that you have a vaginal infection. They should see their health care provider and be treated if they have problems, such as a mild rash or itching.  During treatment, it is important that you follow these instructions:  Avoid sexual activity or use condoms correctly.  Do not douche.  Avoid alcohol as directed by your health care provider.  Avoid breastfeeding as directed by your health care provider. SEEK MEDICAL CARE IF:   Your symptoms are not improving after 3 days of treatment.  You have increased discharge or pain.  You have a fever. MAKE SURE YOU:   Understand these instructions.  Will watch your condition.  Will get help right away if you are not doing well or get worse. FOR MORE INFORMATION  Centers for Disease Control and Prevention, Division of STD Prevention: SolutionApps.co.zawww.cdc.gov/std American Sexual Health Association (ASHA): www.ashastd.org    This information is not intended to replace advice given to you by your health care provider. Make sure you discuss any questions you have with your health care provider.   Document Released: 11/22/2005 Document Revised: 12/13/2014 Document Reviewed: 07/04/2013 Elsevier Interactive Patient Education [employer]2016 Elsevier Inc.

## 2016-05-20 LAB — GC/CHLAMYDIA PROBE AMP (~~LOC~~) NOT AT ARMC
CHLAMYDIA, DNA PROBE: NEGATIVE
Neisseria Gonorrhea: NEGATIVE

## 2017-01-04 IMAGING — US US PELVIS COMPLETE
1 series · 14 of 25 positions shown · non-contrast
Comparison: None

CLINICAL DATA: Patient with vaginal swelling and discharge.

EXAM:
TRANSABDOMINAL AND TRANSVAGINAL ULTRASOUND OF PELVIS
TECHNIQUE: Both transabdominal and transvaginal ultrasound examinations of the
pelvis were performed. Transabdominal technique was performed for
global imaging of the pelvis including uterus, ovaries, adnexal
regions, and pelvic cul-de-sac. It was necessary to proceed with
endovaginal exam following the transabdominal exam to visualize the
adnexal structures.

[Series 1: us pelvis complete · 0.18mm/px · 14 of 60 slices shown]
[im 1/60]
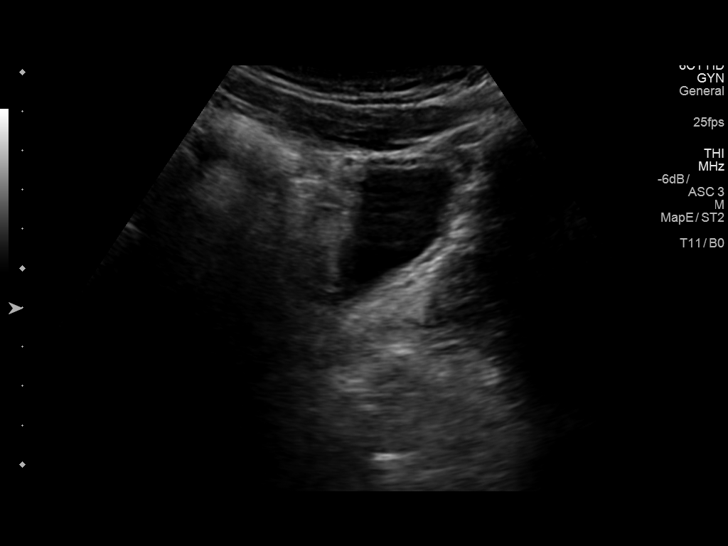
[im 5/60]
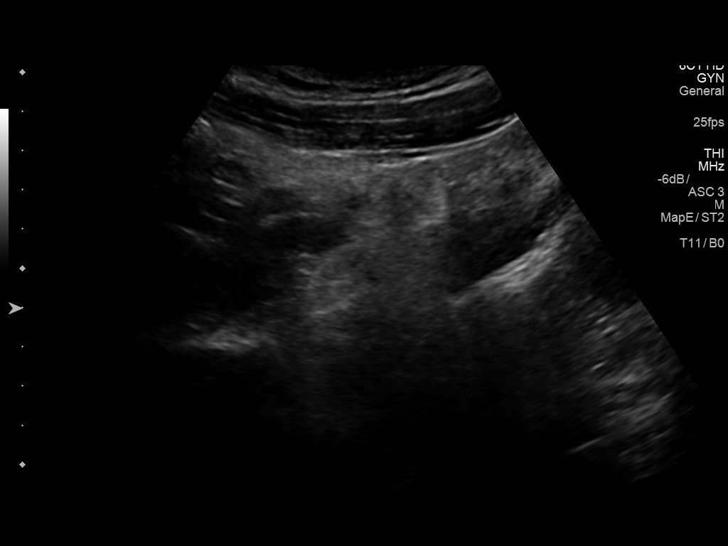
[im 10/60]
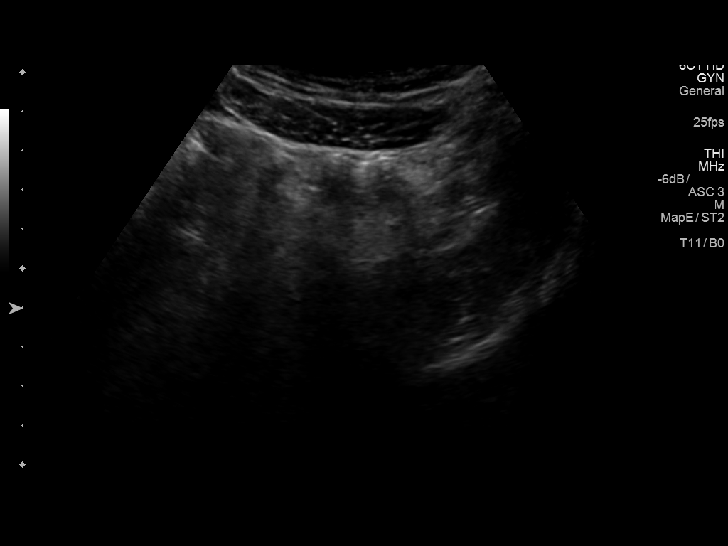
[im 15/60]
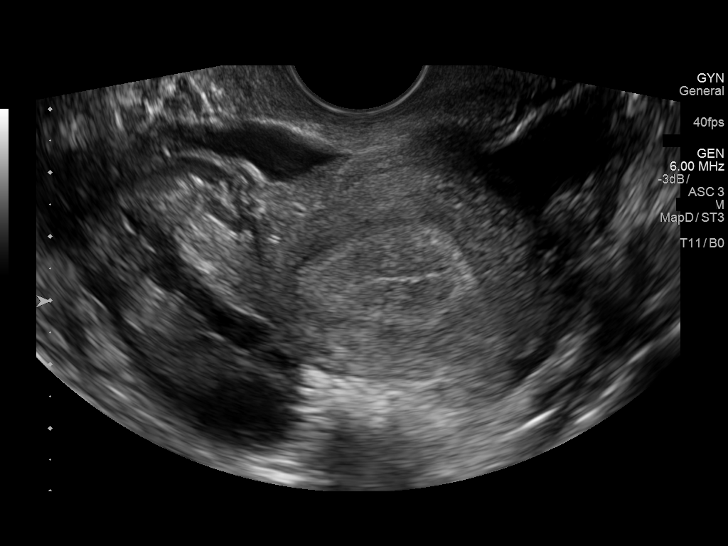
[im 20/60]
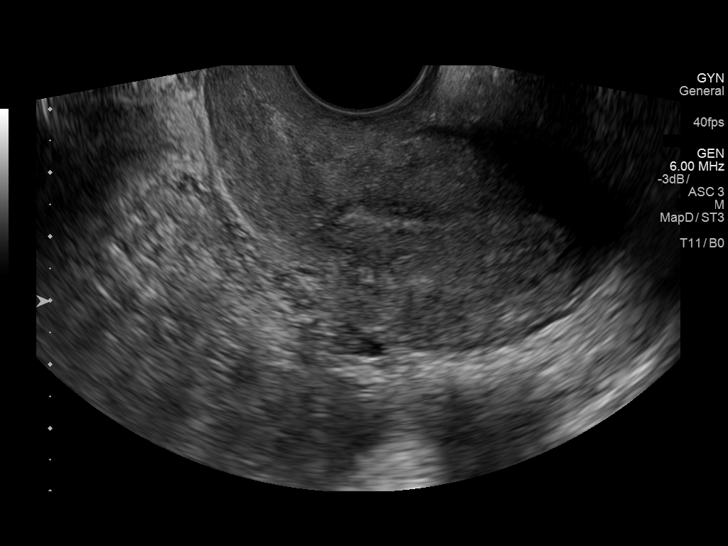
[im 23/60]
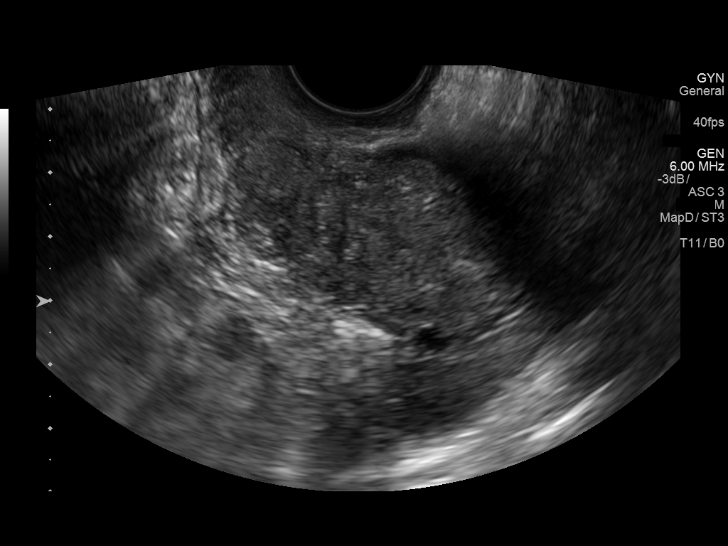
[im 28/60]
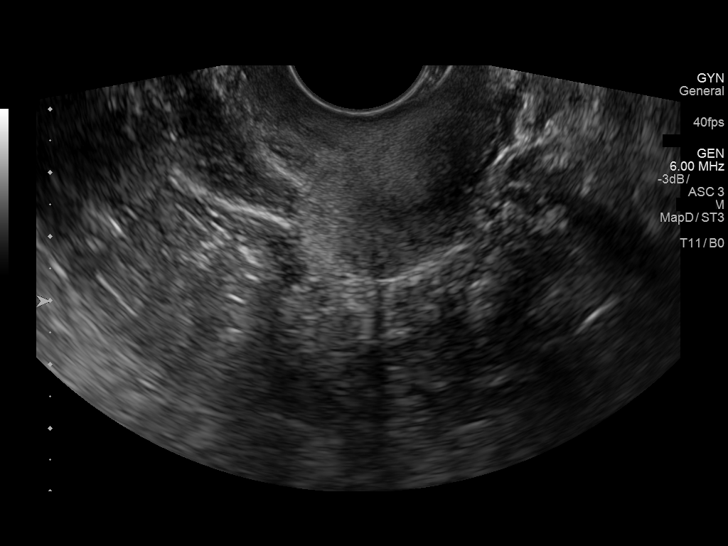
[im 32/60]
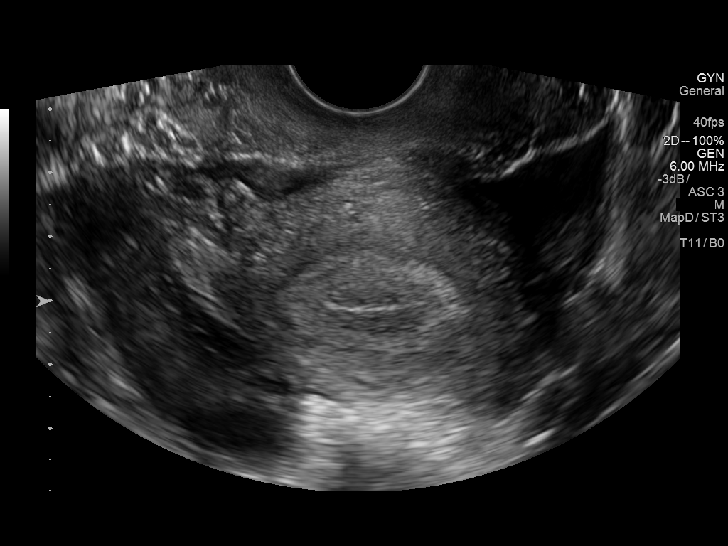
[im 37/60]
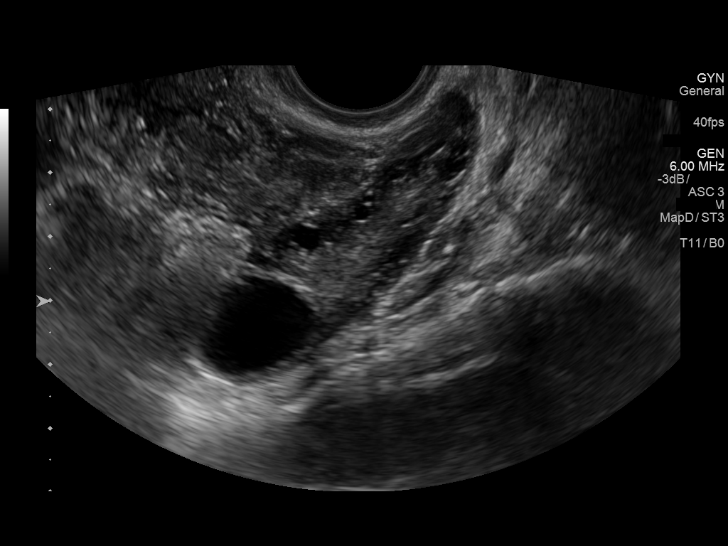
[im 40/60]
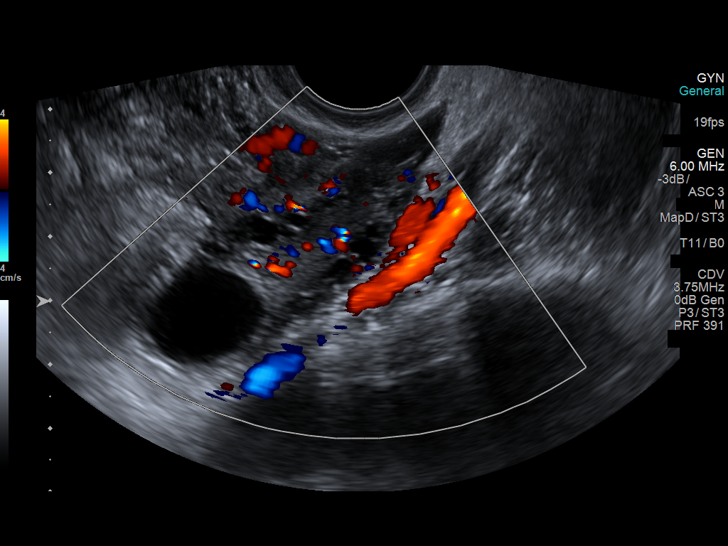
[im 45/60]
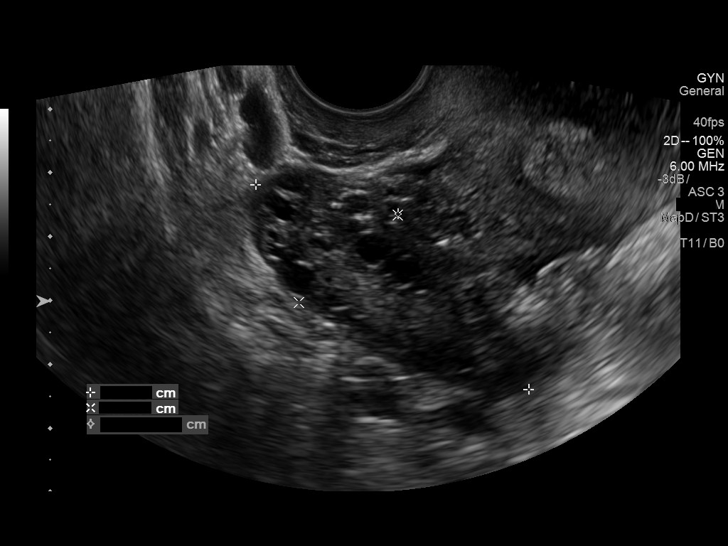
[im 50/60]
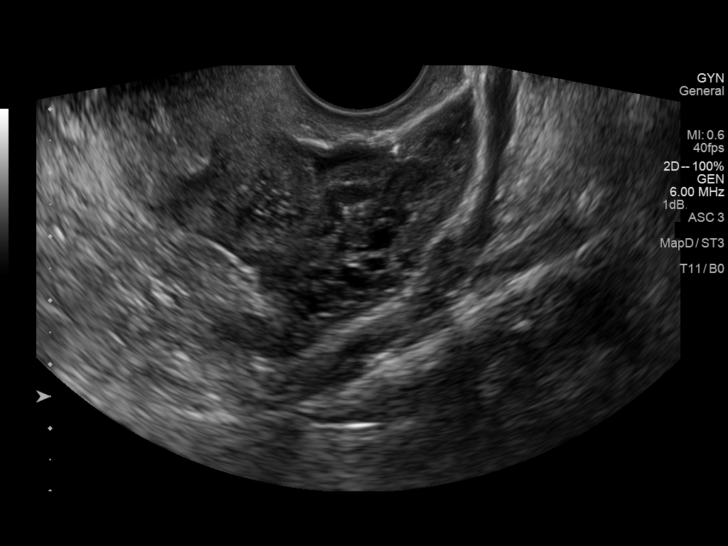
[im 55/60]
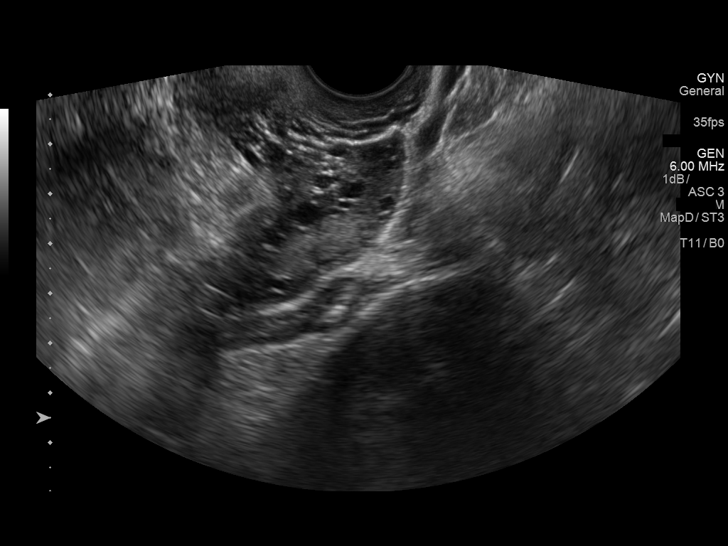
[im 60/60]
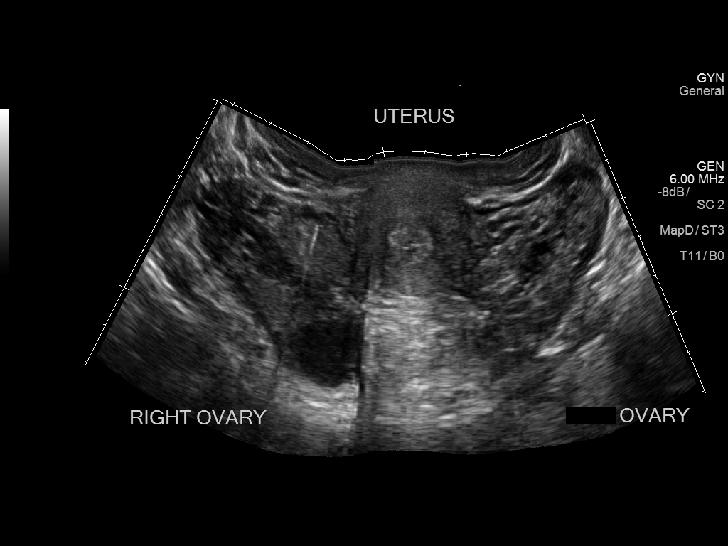

[14 of 25 positions shown; findings below may reference images not displayed]

FINDINGS: Uterus

Measurements: 7.3 x 3.6 x 4.8 cm. Retroverted. No fibroids or other
mass visualized.

Endometrium

Thickness: 15 mm.  No focal abnormality visualized.

Right ovary

Measurements: 6.1 x 2.3 x 3.0 cm. Normal appearance/no adnexal mass.

Left ovary

Measurements: 5.3 x 2.1 x 2.6 cm. Normal appearance/no adnexal mass.

Other findings

Small amount of free fluid in the pelvis.
IMPRESSION: Unremarkable pelvic ultrasound.

## 2017-06-07 ENCOUNTER — Emergency Department (HOSPITAL_COMMUNITY)
Admission: EM | Admit: 2017-06-07 | Discharge: 2017-06-07 | Disposition: A | Discharge status: Home / Self Care | Payer: Medicaid Other | Attending: Emergency Medicine | Admitting: Emergency Medicine

## 2017-06-07 ENCOUNTER — Encounter (HOSPITAL_COMMUNITY): Payer: Self-pay

## 2017-06-07 DIAGNOSIS — Z79899 Other long term (current) drug therapy: Secondary | ICD-10-CM | POA: Diagnosis not present

## 2017-06-07 DIAGNOSIS — R1084 Generalized abdominal pain: Secondary | ICD-10-CM | POA: Diagnosis not present

## 2017-06-07 DIAGNOSIS — N921 Excessive and frequent menstruation with irregular cycle: Secondary | ICD-10-CM | POA: Diagnosis not present

## 2017-06-07 DIAGNOSIS — N939 Abnormal uterine and vaginal bleeding, unspecified: Secondary | ICD-10-CM | POA: Diagnosis present

## 2017-06-07 LAB — URINALYSIS, ROUTINE W REFLEX MICROSCOPIC
Bilirubin Urine: NEGATIVE
GLUCOSE, UA: NEGATIVE mg/dL
Ketones, ur: 20 mg/dL — AB
Leukocytes, UA: NEGATIVE
NITRITE: NEGATIVE
PH: 5 (ref 5.0–8.0)
Protein, ur: NEGATIVE mg/dL
SPECIFIC GRAVITY, URINE: 1.021 (ref 1.005–1.030)

## 2017-06-07 LAB — CBC
HCT: 35 % — ABNORMAL LOW (ref 36.0–46.0)
HEMOGLOBIN: 11.1 g/dL — AB (ref 12.0–15.0)
MCH: 26.1 pg (ref 26.0–34.0)
MCHC: 31.7 g/dL (ref 30.0–36.0)
MCV: 82.2 fL (ref 78.0–100.0)
PLATELETS: 309 10*3/uL (ref 150–400)
RBC: 4.26 MIL/uL (ref 3.87–5.11)
RDW: 15.2 % (ref 11.5–15.5)
WBC: 8.1 10*3/uL (ref 4.0–10.5)

## 2017-06-07 LAB — COMPREHENSIVE METABOLIC PANEL
ALBUMIN: 4 g/dL (ref 3.5–5.0)
ALK PHOS: 56 U/L (ref 38–126)
ALT: 10 U/L — AB (ref 14–54)
ANION GAP: 7 (ref 5–15)
AST: 17 U/L (ref 15–41)
BUN: <5 mg/dL — ABNORMAL LOW (ref 6–20)
CALCIUM: 9.1 mg/dL (ref 8.9–10.3)
CO2: 22 mmol/L (ref 22–32)
CREATININE: 0.69 mg/dL (ref 0.44–1.00)
Chloride: 109 mmol/L (ref 101–111)
GFR calc non Af Amer: >60 mL/min (ref >60)
GLUCOSE: 83 mg/dL (ref 65–99)
Potassium: 3.6 mmol/L (ref 3.5–5.1)
Sodium: 138 mmol/L (ref 135–145)
TOTAL PROTEIN: 7.2 g/dL (ref 6.5–8.1)
Total Bilirubin: 0.6 mg/dL (ref 0.3–1.2)

## 2017-06-07 LAB — I-STAT BETA HCG BLOOD, ED (MC, WL, AP ONLY): I-stat hCG, quantitative: <5 m[IU]/mL (ref <5)

## 2017-06-07 LAB — WET PREP, GENITAL
CLUE CELLS WET PREP: NONE SEEN
SPERM: NONE SEEN
TRICH WET PREP: NONE SEEN
YEAST WET PREP: NONE SEEN

## 2017-06-07 LAB — LIPASE, BLOOD: Lipase: 29 U/L (ref 11–51)

## 2017-06-07 MED ORDER — NAPROXEN 500 MG PO TABS: 500.0000 mg | ORAL_TABLET | Freq: Two times a day (BID) | ORAL | 0 refills | Status: DC

## 2017-06-07 MED ORDER — KETOROLAC TROMETHAMINE 30 MG/ML IJ SOLN
30.0000 mg | Freq: Once | INTRAMUSCULAR | Status: AC
  Administered 2017-06-07: 30 mg via INTRAVENOUS
  Filled 2017-06-07: qty 1

## 2017-06-07 MED ORDER — SODIUM CHLORIDE 0.9 % IV BOLUS (SEPSIS)
1000.0000 mL | Freq: Once | INTRAVENOUS | Status: AC
  Administered 2017-06-07: 1000 mL via INTRAVENOUS

## 2017-06-07 NOTE — ED Notes (Addendum)
Pt reports she is having a heavy period and [place] [place] [facility]. Pt states she took her 800 mg ibuprofen with no relief. Pt also reports she been drinking plenty of fluids and denies LOC. Pt also reports she has gone through a box of pads today (she thinks there are 16 in a box) since 8 am today.

## 2017-06-07 NOTE — ED Notes (Signed)
Attempted IV Left FA X1

## 2017-06-07 NOTE — ED Triage Notes (Signed)
Pt reports she started her menstrual cycle yesterday and is experiencing heavy bleeding and severe cramping. She states she has a hx of heavy periods but this time she feels lightheaded. Hx of anemia. She denies LOC.

## 2017-06-07 NOTE — ED Provider Notes (Signed)
[facility]-EMERGENCY DEPT Provider Note   CSN: [id number] Arrival date & time: 06/07/17  1344     History   Chief Complaint Chief Complaint  Patient presents with  . Menorrhagia    HPI Cassidy Kirk is a 21 y.o. female.  The history is provided by the patient and medical records.    21 year old female with history of anemia and menorrhagia, presenting to the ED for heavy vaginal bleeding.  Patient states she is a long history of heavy and irregular periods. States periods, irregular intervals and last variable length of time. States she started current menstrual cycle yesterday and is having heavier bleeding than normal. States throughout the day today she is on through an entire box of pads almost. States she has not had any passage of clots. She is having a lot of lower abdominal cramping but denies any pelvic pain or abnormal vaginal discharge. She is not having any current urinary symptoms. States previously she was on birth control to help with her heavy periods and it was working at the time, but she stopped taking it. States she is in the process of finding a new OB/GYN.  Past Medical History:  Diagnosis Date  . Anemia   . H/O bladder problems     Patient Active Problem List   Diagnosis Date Noted  . Sports physical 05/02/2013    History reviewed. No pertinent surgical history.  OB History    No data available       Home Medications    Prior to Admission medications   Medication Sig Start Date End Date Taking? Authorizing Provider  metroNIDAZOLE (FLAGYL) 500 MG tablet Take 1 tablet (500 mg total) by mouth 2 (two) times daily. 05/19/16   Jerre Simon, PA    Family History Family History  Problem Relation Age of Onset  . Heart attack Father 16  . Sudden death Neg Hx     Social History Social History  Substance Use Topics  . Smoking status: Never Smoker  . Smokeless tobacco: Never Used  . Alcohol use No     Allergies   Patient has no known  allergies.   Review of Systems Review of Systems  Gastrointestinal: Positive for abdominal pain (cramping).  Genitourinary: Positive for vaginal bleeding.  All other systems reviewed and are negative.    Physical Exam Updated Vital Signs BP 106/72 (BP Location: Right Arm)   Pulse (!) 54   Temp 98.3 F (36.8 C) (Oral)   Resp 16   Ht 5\' 7"  (1.702 m)   Wt 83.9 kg (185 lb)   LMP 06/06/2017   SpO2 100%   BMI 28.98 kg/m   Physical Exam  Constitutional: She is oriented to person, place, and time. She appears well-developed and well-nourished.  HENT:  Head: Normocephalic and atraumatic.  Mouth/Throat: Oropharynx is clear and moist.  Eyes: Conjunctivae and EOM are normal. Pupils are equal, round, and reactive to light.  Neck: Normal range of motion.  Cardiovascular: Normal rate, regular rhythm and normal heart sounds.   Pulmonary/Chest: Effort normal and breath sounds normal. No respiratory distress. She has no wheezes.  Abdominal: Soft. Bowel sounds are normal. There is no tenderness. There is no rebound.  Genitourinary:  Genitourinary Comments: Exam chaperoned by RN Normal female external genitalia without visible lesions or rashes, no appreciable vaginal discharge, moderate vaginal bleeding, cervical os closed, no adnexal or CMT  Musculoskeletal: Normal range of motion.  Neurological: She is alert and oriented to person, place, and time.  Skin: Skin is warm and dry.  Psychiatric: She has a normal mood and affect.  Nursing note and vitals reviewed.    ED Treatments / Results  Labs (all labs ordered are listed, but only abnormal results are displayed) Labs Reviewed  WET PREP, GENITAL - Abnormal; Notable for the following:       Result Value   WBC, Wet Prep HPF POC MODERATE (*)    All other components within normal limits  COMPREHENSIVE METABOLIC PANEL - Abnormal; Notable for the following:    BUN <5 (*)    ALT 10 (*)    All other components within normal limits  CBC  - Abnormal; Notable for the following:    Hemoglobin 11.1 (*)    HCT 35.0 (*)    All other components within normal limits  URINALYSIS, ROUTINE W REFLEX MICROSCOPIC - Abnormal; Notable for the following:    Hgb urine dipstick LARGE (*)    Ketones, ur 20 (*)    Bacteria, UA RARE (*)    Squamous Epithelial / LPF 0-5 (*)    All other components within normal limits  LIPASE, BLOOD  I-STAT BETA HCG BLOOD, ED ([facility], [facility], [facility] ONLY)  GC/CHLAMYDIA PROBE AMP ([place]) NOT AT [facility]    EKG  EKG Interpretation None       Radiology No results found.  Procedures Procedures (including critical care time)  Medications Ordered in ED Medications  sodium chloride 0.9 % bolus 1,000 mL (0 mLs Intravenous Stopped 06/07/17 1911)  ketorolac (TORADOL) 30 MG/ML injection 30 mg (30 mg Intravenous Given 06/07/17 1701)     Initial Impression / Assessment and Plan / ED Course  I have reviewed the triage vital signs and the nursing notes.  Pertinent labs & imaging results that were available during my care of the patient were reviewed by me and considered in my medical decision making (see chart for details).  21 year old female here with heavy periods. Has history of same. Was on birth control but stopped taking it.  She reports some lower abdominal cramping. She is afebrile and nontoxic. Abdominal exam is benign. Pelvic exam was performed, moderate amount of vaginal bleeding, no appreciable discharge. No adnexal or cervical motion tenderness. Lab work obtained, hemoglobin mildly low but stable. Wet prep with WBCs noted. UA without signs of infection. There is noted blood, however suspect this is menstrual bleeding. No current urinary symptoms. Patient given IV fluids and Toradol with improvement of her cramping. Bleeding does not appear life-threatening. Vital signs remained stable. Feel she is stable for discharge home. I recommended that she follow-up with her OB/GYN as she may need to go back on birth  control for adequate control of her periods. Will discharge home with Naprosyn for cramping.  Discussed plan with patient, she acknowledged understanding and agreed with plan of care.  Return precautions given for new or worsening symptoms.  Final Clinical Impressions(s) / ED Diagnoses   Final diagnoses:  Menorrhagia with irregular cycle    New Prescriptions New Prescriptions   NAPROXEN (NAPROSYN) 500 MG TABLET    Take 1 tablet (500 mg total) by mouth 2 (two) times daily with a meal.     Garlon HatchetSanders, Teddie Mehta M, PA-C 06/07/17 Angelene [id number]    Shaune PollackIsaacs, [place], MD 06/08/17 1120

## 2017-06-07 NOTE — Discharge Instructions (Signed)
Your blood work today looked ok. No signs of pelvic or urinary infection seen but we will call you if your cultures are abnormal. Take the naprosyn for cramping.  Can take tylenol with this if needed. Would recommend to follow-up with an OB-GYN, may need to re-start birth control pills to help control your period. [facility] here [facility] for any [facility] issues.

## 2017-06-09 LAB — GC/CHLAMYDIA PROBE AMP (~~LOC~~) NOT AT ARMC
CHLAMYDIA, DNA PROBE: NEGATIVE
NEISSERIA GONORRHEA: NEGATIVE

## 2017-11-28 ENCOUNTER — Encounter (HOSPITAL_BASED_OUTPATIENT_CLINIC_OR_DEPARTMENT_OTHER): Payer: Self-pay | Admitting: Adult Health

## 2017-11-28 ENCOUNTER — Other Ambulatory Visit: Payer: Self-pay

## 2017-11-28 ENCOUNTER — Emergency Department (HOSPITAL_BASED_OUTPATIENT_CLINIC_OR_DEPARTMENT_OTHER)
Admission: EM | Admit: 2017-11-28 | Discharge: 2017-11-28 | Disposition: A | Discharge status: Home / Self Care | Payer: Self-pay | Attending: Emergency Medicine | Admitting: Emergency Medicine

## 2017-11-28 ENCOUNTER — Emergency Department (HOSPITAL_BASED_OUTPATIENT_CLINIC_OR_DEPARTMENT_OTHER): Payer: Self-pay

## 2017-11-28 DIAGNOSIS — R110 Nausea: Secondary | ICD-10-CM | POA: Insufficient documentation

## 2017-11-28 DIAGNOSIS — Z79899 Other long term (current) drug therapy: Secondary | ICD-10-CM | POA: Insufficient documentation

## 2017-11-28 DIAGNOSIS — K5903 Drug induced constipation: Secondary | ICD-10-CM

## 2017-11-28 LAB — URINALYSIS, ROUTINE W REFLEX MICROSCOPIC
BILIRUBIN URINE: NEGATIVE
GLUCOSE, UA: NEGATIVE mg/dL
HGB URINE DIPSTICK: NEGATIVE
KETONES UR: NEGATIVE mg/dL
Leukocytes, UA: NEGATIVE
NITRITE: NEGATIVE
PH: 6 (ref 5.0–8.0)
Protein, ur: NEGATIVE mg/dL
Specific Gravity, Urine: 1.02 (ref 1.005–1.030)

## 2017-11-28 LAB — PREGNANCY, URINE: Preg Test, Ur: NEGATIVE

## 2017-11-28 MED ORDER — ONDANSETRON 4 MG PO TBDP: 4.0000 mg | ORAL_TABLET | Freq: Three times a day (TID) | ORAL | 0 refills | Status: DC | PRN

## 2017-11-28 MED ORDER — FLEET ENEMA 7-19 GM/118ML RE ENEM
1.0000 | ENEMA | Freq: Once | RECTAL | Status: AC
  Administered 2017-11-28: 1 via RECTAL
  Filled 2017-11-28: qty 1

## 2017-11-28 MED ORDER — ONDANSETRON 4 MG PO TBDP
4.0000 mg | ORAL_TABLET | Freq: Once | ORAL | Status: AC
  Administered 2017-11-28: 4 mg via ORAL
  Filled 2017-11-28: qty 1

## 2017-11-28 MED ORDER — SENNOSIDES-DOCUSATE SODIUM 8.6-50 MG PO TABS
1.0000 | ORAL_TABLET | Freq: Every day | ORAL | 0 refills | Status: DC
Start: 2017-11-28 — End: 2020-06-04

## 2017-11-28 NOTE — Discharge Instructions (Signed)
Please read the attached information regarding your condition. Take senna as needed for your constipation. Take Zofran as needed for nausea. Push fluids to maintain adequate hydration. Follow-up with your primary care provider and surgeon as needed for further evaluation. Return to ED for worsening symptoms, severe abdominal pain, increased vomiting, lightheadedness or loss of consciousness.

## 2017-11-28 NOTE — ED Notes (Signed)
Pt reports passing some stool and feeling slightly better

## 2017-11-28 NOTE — ED Provider Notes (Signed)
[facility] EMERGENCY DEPARTMENT Provider Note   CSN: [id number] Arrival date & time: 11/28/17  1825     History   Chief Complaint Chief Complaint  Patient presents with  . Post-op Problem    HPI Cassidy Kirk is a 21 y.o. female who is status post cholecystectomy 5 days ago, who presents to ED for evaluation of nausea, constipation since surgery was done.  She states that even prior to the surgery for a few weeks she did not have normal bowel movements.  She has been taking hydrocodone on 2-3 times a day to help with pain, mostly taking it at night.  She took 1 dose of Dulcolax today with no relief in her symptoms.  She states she has been passing flatulence normally.  Feels as though she needs to use the bathroom but when she tries to have a bowel movement, "nothing comes out."  She also feels fatigued and decreased appetite but denies any vomiting.  She was not given any nausea medications after her surgery.  She denies any urinary symptoms, chest pain, shortness of breath, fevers.  States that she has some abdominal pain with palpation but is otherwise feeling well, pain-wise.  HPI  Past Medical History:  Diagnosis Date  . Anemia   . H/O bladder problems     Patient Active Problem List   Diagnosis Date Noted  . Sports physical 05/02/2013    Past Surgical History:  Procedure Laterality Date  . CHOLECYSTECTOMY      OB History    No data available       Home Medications    Prior to Admission medications   Medication Sig Start Date End Date Taking? Authorizing Provider  metroNIDAZOLE (FLAGYL) 500 MG tablet Take 1 tablet (500 mg total) by mouth 2 (two) times daily. 05/19/16   Focht, Joyce CopaJessica L, PA  naproxen (NAPROSYN) 500 MG tablet Take 1 tablet (500 mg total) by mouth 2 (two) times daily with a meal. 06/07/17   Garlon HatchetSanders, Lisa M, PA-C  ondansetron (ZOFRAN ODT) 4 MG disintegrating tablet Take 1 tablet (4 mg total) by mouth every 8 (eight) hours as needed for nausea  or vomiting. 11/28/17   Lillieann Pavlich, PA-C  senna-docusate (SENOKOT-S) 8.6-50 MG tablet Take 1 tablet by mouth daily. 11/28/17   Dietrich PatesKhatri, Sofi Bryars, PA-C    Family History Family History  Problem Relation Age of Onset  . Heart attack Father 1227  . Sudden death Neg Hx     Social History Social History   Tobacco Use  . Smoking status: Never Smoker  . Smokeless tobacco: Never Used  Substance Use Topics  . Alcohol use: No  . Drug use: No     Allergies   Patient has no known allergies.   Review of Systems Review of Systems  Constitutional: Negative for appetite change, chills and fever.  HENT: Negative for ear pain, rhinorrhea, sneezing and sore throat.   Eyes: Negative for photophobia and visual disturbance.  Respiratory: Negative for cough, chest tightness, shortness of breath and wheezing.   Cardiovascular: Negative for chest pain and palpitations.  Gastrointestinal: Positive for constipation and nausea. Negative for abdominal pain, blood in stool, diarrhea and vomiting.  Genitourinary: Negative for dysuria, hematuria and urgency.  Musculoskeletal: Negative for myalgias.  Skin: Negative for rash.  Neurological: Negative for dizziness, weakness and light-headedness.     Physical Exam Updated Vital Signs BP 122/85   Pulse 100   Temp 98.1 F (36.7 C) (Oral)   Resp 18  LMP 11/03/2017 (Approximate) Comment: (-) urine pregnancy//a.c.  SpO2 100%   Physical Exam  Constitutional: She appears well-developed and well-nourished. No distress.  HENT:  Head: Normocephalic and atraumatic.  Nose: Nose normal.  Eyes: Conjunctivae and EOM are normal. Right eye exhibits no discharge. Left eye exhibits no discharge. No scleral icterus.  Neck: Normal range of motion. Neck supple.  Cardiovascular: Normal rate, regular rhythm, normal heart sounds and intact distal pulses. Exam reveals no gallop and no friction rub.  No murmur heard. Pulmonary/Chest: Effort normal and breath sounds  normal. No respiratory distress.  Abdominal: Soft. Bowel sounds are normal. She exhibits no distension. There is no tenderness. There is no guarding.  For well-healing, laparoscopic scars noted on abdomen.  Genitourinary: Rectum normal.  Genitourinary Comments: Fecal impaction palpated in rectal vault.  No external abnormalities noted.  RN served as Biomedical engineer during exam.  Musculoskeletal: Normal range of motion. She exhibits no edema.  Neurological: She is alert. She exhibits normal muscle tone. Coordination normal.  Skin: Skin is warm and dry. No rash noted.  Psychiatric: She has a normal mood and affect.  Nursing note and vitals reviewed.    ED Treatments / Results  Labs (all labs ordered are listed, but only abnormal results are displayed) Labs Reviewed  PREGNANCY, URINE  URINALYSIS, ROUTINE W REFLEX MICROSCOPIC    EKG  EKG Interpretation None       Radiology Dg Abdomen 1 View  Result Date: 11/28/2017 CLINICAL DATA:  Post op lap cholecystectomy with nausea and no bowel movement EXAM: ABDOMEN - 1 VIEW COMPARISON:  None. FINDINGS: Nonobstructed gas pattern with moderate to large amount of stool in the colon. Moderate formed stool in the rectum. No abnormal calcification IMPRESSION: Nonobstructed gas pattern with moderate to large amount of stool in the colon Electronically Signed   By: Jasmine Pang M.D.   On: 11/28/2017 19:13    Procedures Fecal disimpaction Date/Time: 11/28/2017 9:09 PM Performed by: Dietrich Pates, PA-C Authorized by: Dietrich Pates, PA-C  Consent: Verbal consent obtained. Consent given by: patient Patient understanding: patient states understanding of the procedure being performed Patient identity confirmed: verbally with patient Patient tolerance: [name]    (including critical care time)  Medications Ordered in ED Medications  sodium phosphate (FLEET) 7-19 GM/118ML enema 1 enema (1  enema Rectal Given 11/28/17 1942)  ondansetron (ZOFRAN-ODT) disintegrating tablet 4 mg (4 mg Oral Given 11/28/17 1941)     Initial Impression / Assessment and Plan / ED Course  I have reviewed the triage vital signs and the nursing notes.  Pertinent labs & imaging results that were available during my care of the patient were reviewed by me and considered in my medical decision making (see chart for details).     Patient presents to ED for evaluation of constipation and nausea.  She is status post cholecystectomy 5 days ago.  She has been taking hydrocodone 2-3 times a day to help with her pain.  She has tried 1 dose of laxative today.  She is overall well-appearing.  Her abdominal laparoscopic scars remain intact with no signs of dehiscence.  She denies any worsening of belly pain.  X-ray showed nonobstructive bowel gas pattern with moderate to large stool burden noted.  Enema was done with no relief in symptoms.  She fecal disimpaction was done with success in bowel movements and complete resolution of patient's symptoms.  Will discharge home with bowel regimen and Zofran to be taken  as needed for nausea.  Patient appears stable for discharge at this time.  Strict return precautions given.  Final Clinical Impressions(s) / ED Diagnoses   Final diagnoses:  Drug-induced constipation    ED Discharge Orders        Ordered    senna-docusate (SENOKOT-S) 8.6-50 MG tablet  Daily     11/28/17 2107    ondansetron (ZOFRAN ODT) 4 MG disintegrating tablet  Every 8 hours PRN     11/28/17 2108     Portions of this note were generated with Dragon dictation software. Dictation errors may occur despite best attempts at proofreading.    Dietrich PatesKhatri, Coumba Kellison, PA-C 11/28/17 2111    Maia PlanLong, Joshua G, MD 11/29/17 1215

## 2017-11-28 NOTE — ED Notes (Signed)
ED provider to bedside to disimpact pt stool

## 2017-11-28 NOTE — ED Triage Notes (Signed)
Presents post=op from 5 days ago from cholecystectomy. She states she has been nauseated and unable to have a BM since the operation, but has not had a good BM for 3 weeks. SHe has been taking hydrocodone for pain. [place]

## 2017-11-28 NOTE — ED Notes (Signed)
ED Provider at bedside. 

## 2018-02-12 ENCOUNTER — Encounter (HOSPITAL_BASED_OUTPATIENT_CLINIC_OR_DEPARTMENT_OTHER): Payer: Self-pay | Admitting: *Deleted

## 2018-02-12 ENCOUNTER — Other Ambulatory Visit: Payer: Self-pay

## 2018-02-12 DIAGNOSIS — Z3A01 Less than 8 weeks gestation of pregnancy: Secondary | ICD-10-CM | POA: Insufficient documentation

## 2018-02-12 DIAGNOSIS — O26891 Other specified pregnancy related conditions, first trimester: Secondary | ICD-10-CM | POA: Insufficient documentation

## 2018-02-12 DIAGNOSIS — R1030 Lower abdominal pain, unspecified: Secondary | ICD-10-CM | POA: Insufficient documentation

## 2018-02-12 DIAGNOSIS — Z79899 Other long term (current) drug therapy: Secondary | ICD-10-CM | POA: Insufficient documentation

## 2018-02-12 NOTE — ED Triage Notes (Signed)
Pt reports she missed her last period. States she is having cramps and has a hx of ovarian cyst

## 2018-02-13 ENCOUNTER — Ambulatory Visit (HOSPITAL_BASED_OUTPATIENT_CLINIC_OR_DEPARTMENT_OTHER)
Admit: 2018-02-13 | Discharge: 2018-02-13 | Disposition: A | Discharge status: Home / Self Care | Payer: Self-pay | Attending: Emergency Medicine | Admitting: Emergency Medicine

## 2018-02-13 ENCOUNTER — Encounter (HOSPITAL_BASED_OUTPATIENT_CLINIC_OR_DEPARTMENT_OTHER): Payer: Self-pay | Admitting: Radiology

## 2018-02-13 ENCOUNTER — Emergency Department (HOSPITAL_BASED_OUTPATIENT_CLINIC_OR_DEPARTMENT_OTHER)
Admission: EM | Admit: 2018-02-13 | Discharge: 2018-02-13 | Disposition: A | Discharge status: Home / Self Care | Payer: Self-pay | Attending: Emergency Medicine | Admitting: Emergency Medicine

## 2018-02-13 DIAGNOSIS — Z3491 Encounter for supervision of normal pregnancy, unspecified, first trimester: Secondary | ICD-10-CM | POA: Insufficient documentation

## 2018-02-13 DIAGNOSIS — Z3A01 Less than 8 weeks gestation of pregnancy: Secondary | ICD-10-CM | POA: Insufficient documentation

## 2018-02-13 LAB — URINALYSIS, ROUTINE W REFLEX MICROSCOPIC
BILIRUBIN URINE: NEGATIVE
GLUCOSE, UA: NEGATIVE mg/dL
HGB URINE DIPSTICK: NEGATIVE
Ketones, ur: NEGATIVE mg/dL
Leukocytes, UA: NEGATIVE
Nitrite: NEGATIVE
Protein, ur: NEGATIVE mg/dL
SPECIFIC GRAVITY, URINE: 1.025 (ref 1.005–1.030)
pH: 5.5 (ref 5.0–8.0)

## 2018-02-13 LAB — WET PREP, GENITAL
SPERM: NONE SEEN
TRICH WET PREP: NONE SEEN
Yeast Wet Prep HPF POC: NONE SEEN

## 2018-02-13 LAB — PREGNANCY, URINE: PREG TEST UR: POSITIVE — AB

## 2018-02-13 LAB — HCG, QUANTITATIVE, PREGNANCY: hCG, Beta Chain, Quant, S: 6369 m[IU]/mL — ABNORMAL HIGH (ref <5)

## 2018-02-13 MED ORDER — PRENATAL COMPLETE 14-0.4 MG PO TABS: 1.0000 | ORAL_TABLET | Freq: Every day | ORAL | 0 refills | Status: DC

## 2018-02-13 NOTE — Discharge Instructions (Signed)
You were seen today and found to be pregnant.  You need an ultrasound later today.  If you have pain that lateralizes to the right or to the left or worsening pain you should be reevaluated.  Additionally if you have any vaginal bleeding you should be reevaluated.

## 2018-02-13 NOTE — ED Provider Notes (Signed)
[facility] EMERGENCY DEPARTMENT Provider Note   CSN: [id number] Arrival date & time: 02/12/18  2338     History   Chief Complaint Chief Complaint  Patient presents with  . Abdominal Pain    HPI Cassidy BalzarineKiyauna Kirk is a 22 y.o. female.  HPI  This is a 22 year old female who presents with lower abdominal cramping.  Patient reports cramping since the last week in February.  Comes and goes.  She also reports nausea.  She reports that she was due to have her period on February 20 but has not had any.  She reports one sexual encounter "on Valentine's Day."  However she does not feel that she could be pregnant.  She denies any vaginal discharge or bleeding.  She denies any lateralizing pain.  She does report some dysuria.  No back pain or fevers.  Patient has never been pregnant before.  Last menstrual period was approximately January 20.  Past Medical History:  Diagnosis Date  . Anemia   . H/O bladder problems     Patient Active Problem List   Diagnosis Date Noted  . Sports physical 05/02/2013    Past Surgical History:  Procedure Laterality Date  . CHOLECYSTECTOMY      OB History    No data available       Home Medications    Prior to Admission medications   Medication Sig Start Date End Date Taking? Authorizing Provider  metroNIDAZOLE (FLAGYL) 500 MG tablet Take 1 tablet (500 mg total) by mouth 2 (two) times daily. 05/19/16   Focht, Joyce CopaJessica L, PA  naproxen (NAPROSYN) 500 MG tablet Take 1 tablet (500 mg total) by mouth 2 (two) times daily with a meal. 06/07/17   Garlon HatchetSanders, Lisa M, PA-C  ondansetron (ZOFRAN ODT) 4 MG disintegrating tablet Take 1 tablet (4 mg total) by mouth every 8 (eight) hours as needed for nausea or vomiting. 11/28/17   Khatri, Hillary BowHina, PA-C  Prenatal Vit-Fe Fumarate-FA (PRENATAL COMPLETE) 14-0.4 MG TABS Take 1 tablet by mouth daily. 02/13/18   Afra Tricarico, Mayer Maskerourtney F, MD  senna-docusate (SENOKOT-S) 8.6-50 MG tablet Take 1 tablet by mouth daily. 11/28/17    Dietrich PatesKhatri, Hina, PA-C    Family History Family History  Problem Relation Age of Onset  . Heart attack Father 1827  . Sudden death Neg Hx     Social History Social History   Tobacco Use  . Smoking status: Never Smoker  . Smokeless tobacco: Never Used  Substance Use Topics  . Alcohol use: Yes    Comment: occasional  . Drug use: Yes    Types: Marijuana     Allergies   Patient has no known allergies.   Review of Systems Review of Systems  Constitutional: Negative for fever.  Respiratory: Negative for shortness of breath.   Cardiovascular: Negative for chest pain.  Gastrointestinal: Positive for nausea. Negative for vomiting.  Genitourinary: Positive for dysuria and pelvic pain. Negative for vaginal bleeding and vaginal discharge.  All other systems reviewed and are negative.    Physical Exam Updated Vital Signs BP 123/81 (BP Location: Right Arm)   Pulse 61   Temp 99.2 F (37.3 C) (Oral)   Resp 16   Ht 5\' 7"  (1.702 m)   Wt 87.8 kg (193 lb 9.6 oz)   LMP 12/25/2017   SpO2 100%   BMI 30.32 kg/m   Physical Exam  Constitutional: She is oriented to person, place, and time. She appears well-developed and well-nourished.  HENT:  Head: Normocephalic and  atraumatic.  Neck: Neck supple.  Cardiovascular: Normal rate, regular rhythm and normal heart sounds.  Pulmonary/Chest: Effort normal. No respiratory distress. She has no wheezes.  Abdominal: Soft. Normal appearance and bowel sounds are normal. There is no tenderness.  Genitourinary:  Genitourinary Comments: Normal external vaginal exam, no bleeding noted, cervical os closed, no adnexal mass or tenderness  Neurological: She is alert and oriented to person, place, and time.  Skin: Skin is warm and dry.  Psychiatric: She has a normal mood and affect.  Nursing note and vitals reviewed.    ED Treatments / Results  Labs (all labs ordered are listed, but only abnormal results are displayed) Labs Reviewed  WET PREP,  GENITAL - Abnormal; Notable for the following components:      Result Value   Clue Cells Wet Prep HPF POC PRESENT (*)    WBC, Wet Prep HPF POC MANY (*)    All other components within normal limits  PREGNANCY, URINE - Abnormal; Notable for the following components:   Preg Test, Ur POSITIVE (*)    All other components within normal limits  HCG, QUANTITATIVE, PREGNANCY - Abnormal; Notable for the following components:   hCG, Beta Chain, Quant, S 6,369 (*)    All other components within normal limits  URINALYSIS, ROUTINE W REFLEX MICROSCOPIC  GC/CHLAMYDIA PROBE AMP ([place]) NOT AT [facility]    EKG  EKG Interpretation None       Radiology No results found.  Procedures Procedures (including critical care time)  Medications Ordered in ED Medications - No data to display   Initial Impression / Assessment and Plan / ED Course  I have reviewed the triage vital signs and the nursing notes.  Pertinent labs & imaging results that were available during my care of the patient were reviewed by me and considered in my medical decision making (see chart for details).     Patient presents with crampy abdominal pain ongoing for several weeks.  Also reports nausea.  Vital signs are reassuring.  Urine pregnancy test is positive.  Confirmatory beta hCG 6369.  She is nontender on exam and without adnexal tenderness or mass.  Vital signs are reassuring.  I was unable to visualize IUP transabdominally.  However, based on last menstrual period she will be 7 weeks 2 days pregnant and she may be too early to visualize transabdominally.  Will order an ultrasound for later today.  Have low suspicion at this time for ectopic as her abdominal exam is benign and she has no evidence of tachycardia or hypotension.  Will start on prenatal vitamin.  Ultrasound ordered.  Follow-up at [facility].  After history, exam, and medical workup I feel the patient has been appropriately medically screened and is safe  for discharge home. Pertinent diagnoses were discussed with the patient. Patient was given return precautions.   Final Clinical Impressions(s) / ED Diagnoses   Final diagnoses:  First trimester pregnancy    ED Discharge Orders        Ordered    US OB Transvaginal     02/13/18 0306    [place] OP OB Comp Less 14 Wks     02/13/18 0306    Prenatal Vit-Fe Fumarate-FA (PRENATAL COMPLETE) 14-0.4 MG TABS  Daily     02/13/18 0306       Shon Baton, MD 02/13/18 [phone]

## 2018-02-13 NOTE — ED Notes (Signed)
Pt and mother given d/c instructions as per chart. Verbalizes understanding. No questions. 

## 2018-02-13 NOTE — ED Notes (Signed)
Pt states she has had abd cramping since Feb 20th. +nausea. Denies vaginal d/c or urinary s/s. Last had intercourse weekend of Valentine's Day.

## 2018-02-13 NOTE — ED Provider Notes (Signed)
U/s results given to patient.  Gestational sac only seen in uterus.  Advised pt to f/u with ob/gyn.  Return precautions given   Rolan BuccoBelfi, Haidynn Almendarez, MD 02/13/18 [phone]

## 2018-02-13 NOTE — ED Notes (Signed)
ED Provider at bedside. 

## 2018-02-14 LAB — GC/CHLAMYDIA PROBE AMP (~~LOC~~) NOT AT ARMC
CHLAMYDIA, DNA PROBE: POSITIVE — AB
Neisseria Gonorrhea: NEGATIVE

## 2018-02-15 ENCOUNTER — Telehealth: Payer: Self-pay | Admitting: Medical

## 2018-02-15 DIAGNOSIS — A749 Chlamydial infection, unspecified: Secondary | ICD-10-CM

## 2018-02-15 MED ORDER — AZITHROMYCIN 250 MG PO TABS: 1000.0000 mg | ORAL_TABLET | Freq: Once | ORAL | 0 refills | Status: AC

## 2018-02-15 NOTE — Telephone Encounter (Signed)
-----   Message from Kathe BectonLori S Berdik, RN sent at 02/15/2018  9:46 AM EDT ----- This patient tested positive for :  chlamydia  She:"has NKDA", I have informed the patient of her results and confirmed her pharmacy is correct in her chart. Please send Rx.   Thank you,   Kathe BectonBerdik, Lori S, RN   [facility]

## 2018-02-15 NOTE — Telephone Encounter (Addendum)
Cassidy Kirk tested positive for  Chlamydia. Patient was called by RN and allergies and pharmacy confirmed. Rx sent to pharmacy of choice.   [name], Fox Salminen N, PA-C 02/15/2018 10:08 AM      ----- Message from Kathe BectonLori S Berdik, RN sent at 02/15/2018  9:46 AM EDT ----- This patient tested positive for :  chlamydia  She:"has NKDA", I have informed the patient of her results and confirmed her pharmacy is correct in her chart. Please send Rx.   Thank you,   Kathe BectonBerdik, Lori S, RN   [facility]

## 2018-03-08 ENCOUNTER — Encounter (HOSPITAL_BASED_OUTPATIENT_CLINIC_OR_DEPARTMENT_OTHER): Payer: Self-pay

## 2018-03-08 ENCOUNTER — Emergency Department (HOSPITAL_BASED_OUTPATIENT_CLINIC_OR_DEPARTMENT_OTHER)
Admission: EM | Admit: 2018-03-08 | Discharge: 2018-03-08 | Disposition: A | Discharge status: Home / Self Care | Payer: Medicaid Other | Attending: Emergency Medicine | Admitting: Emergency Medicine

## 2018-03-08 ENCOUNTER — Other Ambulatory Visit: Payer: Self-pay

## 2018-03-08 DIAGNOSIS — Z79899 Other long term (current) drug therapy: Secondary | ICD-10-CM | POA: Insufficient documentation

## 2018-03-08 DIAGNOSIS — R69 Illness, unspecified: Secondary | ICD-10-CM

## 2018-03-08 DIAGNOSIS — J111 Influenza due to unidentified influenza virus with other respiratory manifestations: Secondary | ICD-10-CM | POA: Insufficient documentation

## 2018-03-08 MED ORDER — ACETAMINOPHEN 325 MG PO TABS
650.0000 mg | ORAL_TABLET | Freq: Once | ORAL | Status: AC
  Administered 2018-03-08: 650 mg via ORAL

## 2018-03-08 MED ORDER — ACETAMINOPHEN 325 MG PO TABS
ORAL_TABLET | ORAL | Status: AC
  Filled 2018-03-08: qty 2

## 2018-03-08 MED ORDER — OSELTAMIVIR PHOSPHATE 75 MG PO CAPS: 75.0000 mg | ORAL_CAPSULE | Freq: Two times a day (BID) | ORAL | 0 refills | Status: DC

## 2018-03-08 MED ORDER — METOCLOPRAMIDE HCL 10 MG PO TABS: 10.0000 mg | ORAL_TABLET | Freq: Four times a day (QID) | ORAL | 0 refills | Status: DC

## 2018-03-08 NOTE — ED Provider Notes (Signed)
[facility] EMERGENCY DEPARTMENT Provider Note   CSN: [id number] Arrival date & time: 03/08/18  1829     History   Chief Complaint Chief Complaint  Patient presents with  . Flu-Like Symptoms    HPI Cassidy Kirk is a 22 y.o. female.  22 yo F with a chief complaint of cough congestion fevers chills myalgias.  The patient woke up this morning and did not feel quite right and laid down to take a nap and when she woke up she felt miserable, having aches all over her body coughing vomiting.  Denies abdominal pain or diarrhea.  Denies dysuria increased frequency or hesitancy.  Denies flank pain.  She had a cousin who had the flu last week.  She recently was diagnosed as being pregnant.  Estimates she is about 8 weeks.  The history is provided by the patient.  Illness  This is a new problem. The current episode started 1 to 2 hours ago. The problem occurs constantly. The problem has not changed since onset.Pertinent negatives include no chest pain, no headaches and no shortness of breath. Nothing aggravates the symptoms. Nothing relieves the symptoms. She has tried nothing for the symptoms. The treatment provided no relief.    Past Medical History:  Diagnosis Date  . Anemia   . H/O bladder problems     Patient Active Problem List   Diagnosis Date Noted  . Sports physical 05/02/2013    Past Surgical History:  Procedure Laterality Date  . CHOLECYSTECTOMY       OB History    Gravida  1   Para      Term      Preterm      AB      Living        SAB      TAB      Ectopic      Multiple      Live Births               Home Medications    Prior to Admission medications   Medication Sig Start Date End Date Taking? Authorizing Provider  metoCLOPramide (REGLAN) 10 MG tablet Take 1 tablet (10 mg total) by mouth every 6 (six) hours. 03/08/18   Melene Plan, DO  metroNIDAZOLE (FLAGYL) 500 MG tablet Take 1 tablet (500 mg total) by mouth 2 (two) times daily.  05/19/16   Focht, Joyce Copa, PA  naproxen (NAPROSYN) 500 MG tablet Take 1 tablet (500 mg total) by mouth 2 (two) times daily with a meal. 06/07/17   Garlon Hatchet, PA-C  ondansetron (ZOFRAN ODT) 4 MG disintegrating tablet Take 1 tablet (4 mg total) by mouth every 8 (eight) hours as needed for nausea or vomiting. 11/28/17   Khatri, Hina, PA-C  oseltamivir (TAMIFLU) 75 MG capsule Take 1 capsule (75 mg total) by mouth every 12 (twelve) hours. 03/08/18   Melene Plan, DO  Prenatal Vit-Fe Fumarate-FA (PRENATAL COMPLETE) 14-0.4 MG TABS Take 1 tablet by mouth daily. 02/13/18   Horton, Mayer Masker, MD  senna-docusate (SENOKOT-S) 8.6-50 MG tablet Take 1 tablet by mouth daily. 11/28/17   Dietrich Pates, PA-C    Family History Family History  Problem Relation Age of Onset  . Heart attack Father 13  . Sudden death Neg Hx     Social History Social History   Tobacco Use  . Smoking status: Never Smoker  . Smokeless tobacco: Never Used  Substance Use Topics  . Alcohol use: Yes  Comment: occasional  . Drug use: Yes    Types: Marijuana     Allergies   Patient has no known allergies.   Review of Systems Review of Systems  Constitutional: Positive for chills and fever.  HENT: Positive for congestion. Negative for rhinorrhea.   Eyes: Negative for redness and visual disturbance.  Respiratory: Positive for cough. Negative for shortness of breath and wheezing.   Cardiovascular: Negative for chest pain and palpitations.  Gastrointestinal: Negative for nausea and vomiting.  Genitourinary: Negative for dysuria and urgency.  Musculoskeletal: Positive for arthralgias and myalgias.  Skin: Negative for pallor and wound.  Neurological: Negative for dizziness and headaches.     Physical Exam Updated Vital Signs BP 115/68 (BP Location: Left Arm)   Pulse 90   Temp (!) 100.8 F (38.2 C) (Oral)   Resp 18   Ht 5\' 7"  (1.702 m)   Wt 87.5 kg (193 lb)   LMP 12/28/2017 (Approximate)   SpO2 100%   BMI 30.23  kg/m   Physical Exam  Constitutional: She is oriented to person, place, and time. She appears well-developed and well-nourished. No distress.  HENT:  Head: Normocephalic and atraumatic.  Swollen turbinates, posterior nasal drip, no noted sinus ttp, tm normal bilaterally.    Eyes: Pupils are equal, round, and reactive to light. EOM are normal.  Neck: Normal range of motion. Neck supple.  Cardiovascular: Normal rate and regular rhythm. Exam reveals no gallop and no friction rub.  No murmur heard. Pulmonary/Chest: Effort normal. She has no wheezes. She has no rales.  Abdominal: Soft. She exhibits no distension. There is no tenderness.  Musculoskeletal: She exhibits no edema or tenderness.  Neurological: She is alert and oriented to person, place, and time.  Skin: Skin is warm and dry. She is not diaphoretic.  Psychiatric: She has a normal mood and affect. Her behavior is normal.  Nursing note and vitals reviewed.    ED Treatments / Results  Labs (all labs ordered are listed, but only abnormal results are displayed) Labs Reviewed - No data to display  EKG None  Radiology No results found.  Procedures Procedures (including critical care time)  Medications Ordered in ED Medications - No data to display   Initial Impression / Assessment and Plan / ED Course  I have reviewed the triage vital signs and the nursing notes.  Pertinent labs & imaging results that were available during my care of the patient were reviewed by me and considered in my medical decision making (see chart for details).     [age] y oF with a chief complaint of influenza-like symptoms.  Patient is well-appearing and nontoxic.  No bacterial source is found on my exam.  Patient is [redacted] weeks pregnant and so I will treat her with Tamiflu as per CDC guidelines.  Given Reglan she has had some vomiting.  Will have her use Tylenol for pain and fever.  OB follow up.   7:05 PM:  I have discussed the  diagnosis/risks/treatment options with the patient and believe the pt to be eligible for discharge home to follow-up with OB. We also discussed returning to the ED immediately if new or worsening sx occur. We discussed the sx which are most concerning (e.g., sudden worsening pain, fever, inability to tolerate by mouth) that necessitate immediate return. Medications administered to the patient during their visit and any new prescriptions provided to the patient are listed below.  Medications given during this visit Medications - No data to display  The patient appears reasonably screen and/or stabilized for discharge and I doubt any other medical condition or other [facility] requiring further screening, evaluation, or treatment in the ED at this time prior to discharge.    Final Clinical Impressions(s) / ED Diagnoses   Final diagnoses:  Influenza-like illness    ED Discharge Orders        Ordered    oseltamivir (TAMIFLU) 75 MG capsule  Every 12 hours     03/08/18 1901    metoCLOPramide (REGLAN) 10 MG tablet  Every 6 hours     03/08/18 1901       Melene Plan, DO 03/08/18 1905

## 2018-03-08 NOTE — ED Triage Notes (Signed)
Pt c/o body aches, vomiting and fever since this morning, has not had any medications, pt is [redacted] weeks pregnant

## 2018-03-08 NOTE — Discharge Instructions (Addendum)
Go to a drugstore and buy unisom and vitaminb6(pyridoxine) Take 1/2 a tab of unisom(12.5mg ) and 25mg  of vitamin b6.  Take this at night before bed.  If you continue to have nausea and vomiting take twice a day.  [facility]  Take the reglan then only if the unisom and b6 are not working.

## 2019-03-15 IMAGING — US US OB TRANSVAGINAL
1 series · 14 of 28 positions shown · non-contrast
Comparison: None.

CLINICAL DATA: Pregnant patient with pelvic pain and cramping.
Quantitative HCG 6,369 today.

EXAM:
OBSTETRIC <14 WK US AND TRANSVAGINAL OB US
TECHNIQUE: Both transabdominal and transvaginal ultrasound examinations were
performed for complete evaluation of the gestation as well as the
maternal uterus, adnexal regions, and pelvic cul-de-sac.
Transvaginal technique was performed to assess early pregnancy.

[Series 1: us ob transvaginal · 0.22mm/px · 14 of 114 slices shown]
[im 5/114]
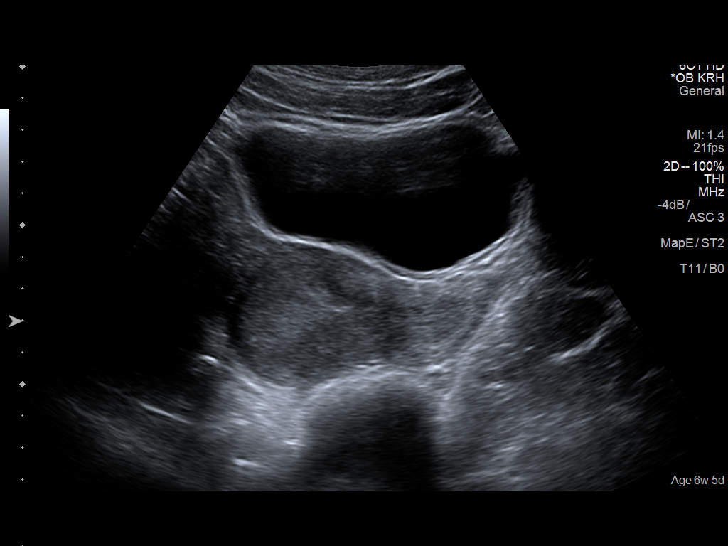
[im 13/114]
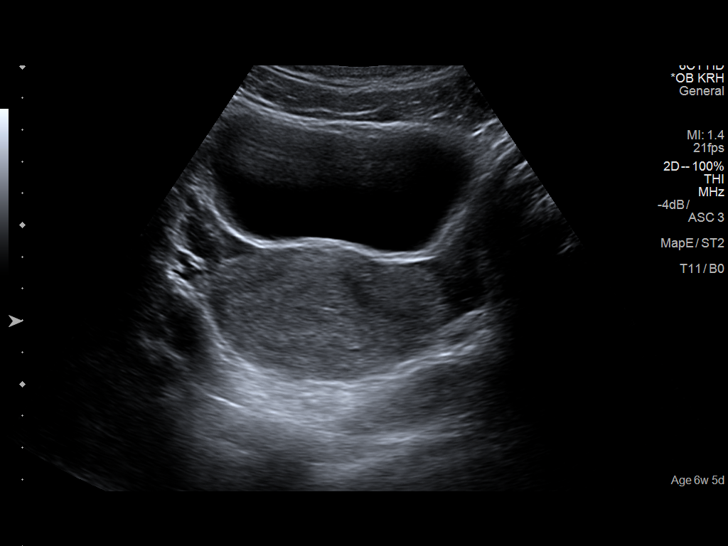
[im 21/114]
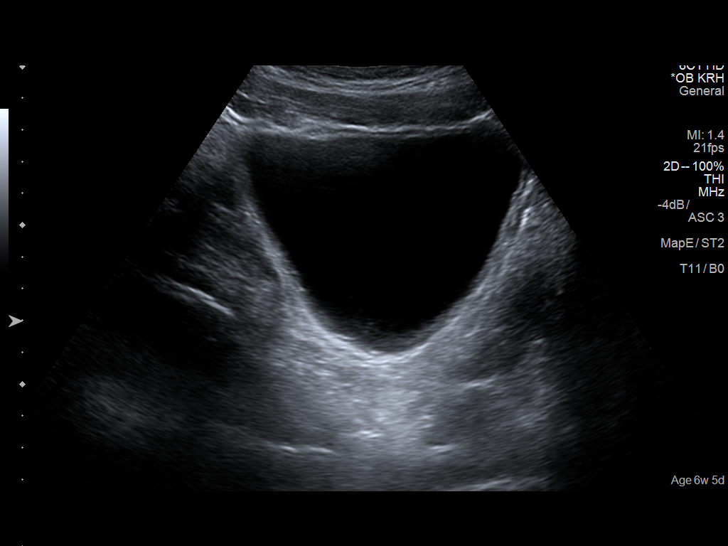
[im 30/114]
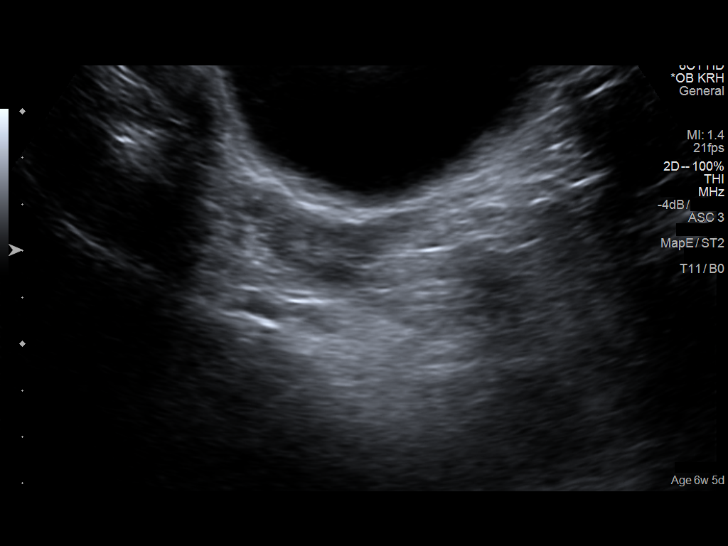
[im 38/114]
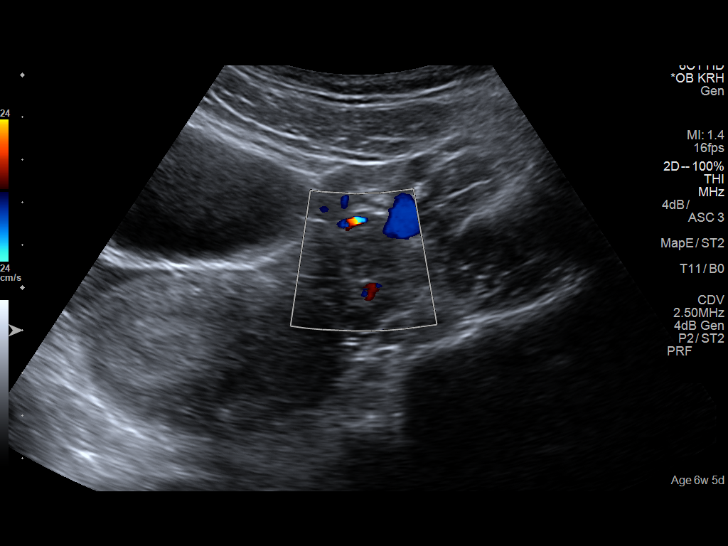
[im 47/114]
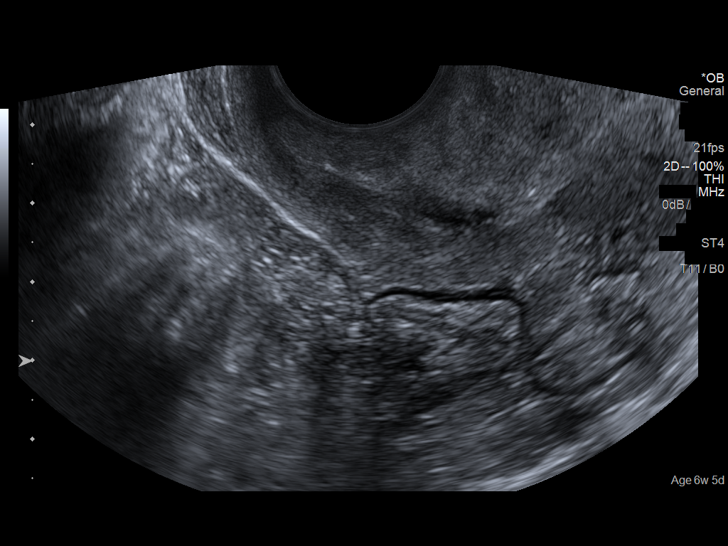
[im 55/114]
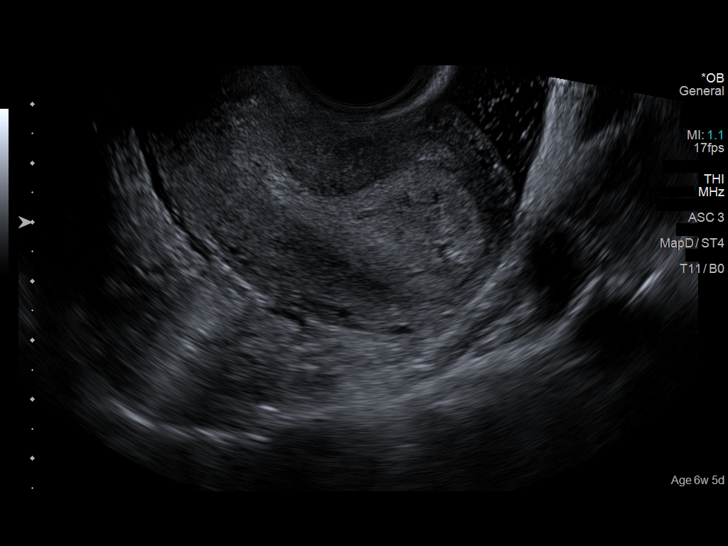
[im 63/114]
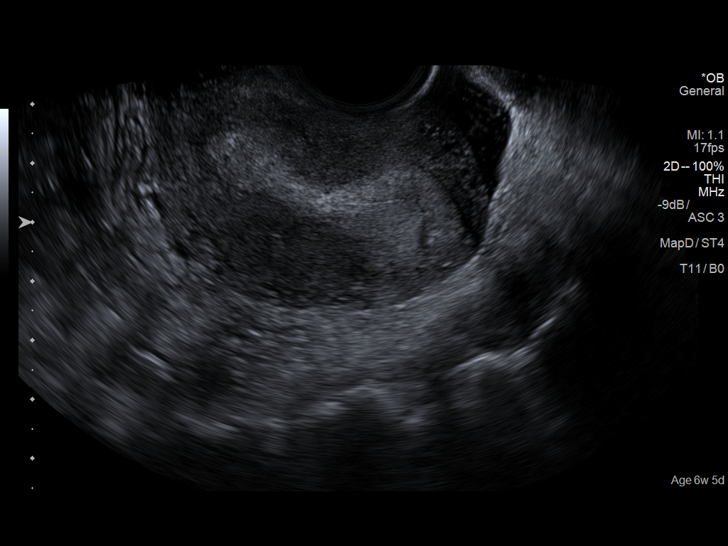
[im 72/114]
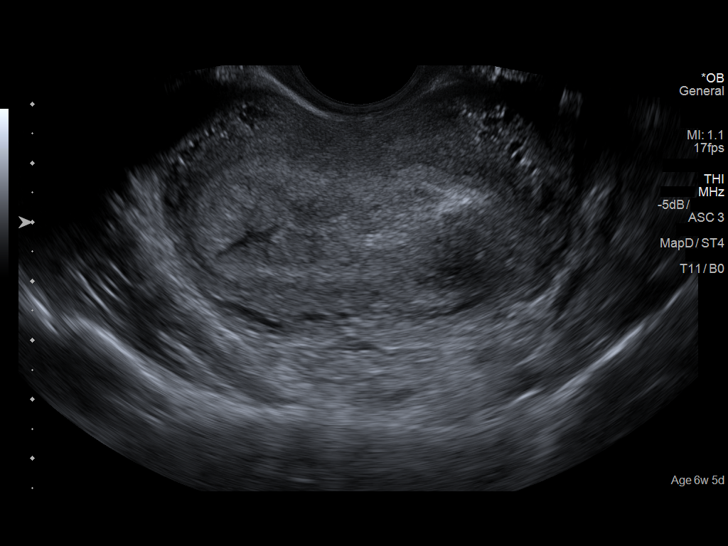
[im 80/114]
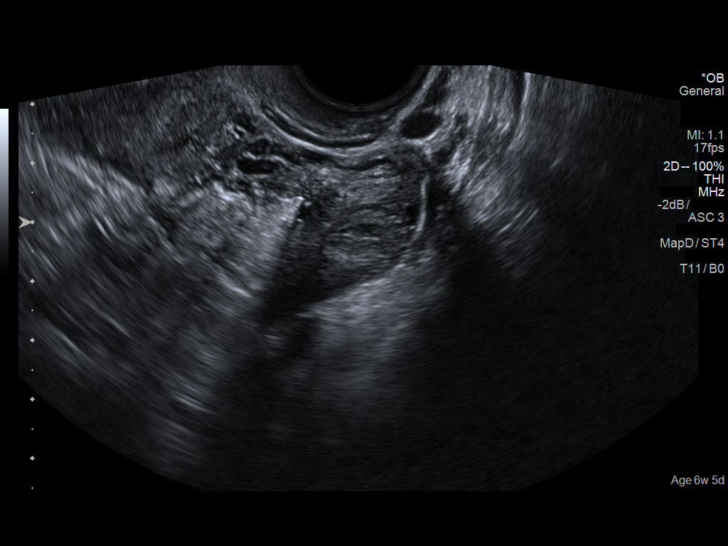
[im 88/114]
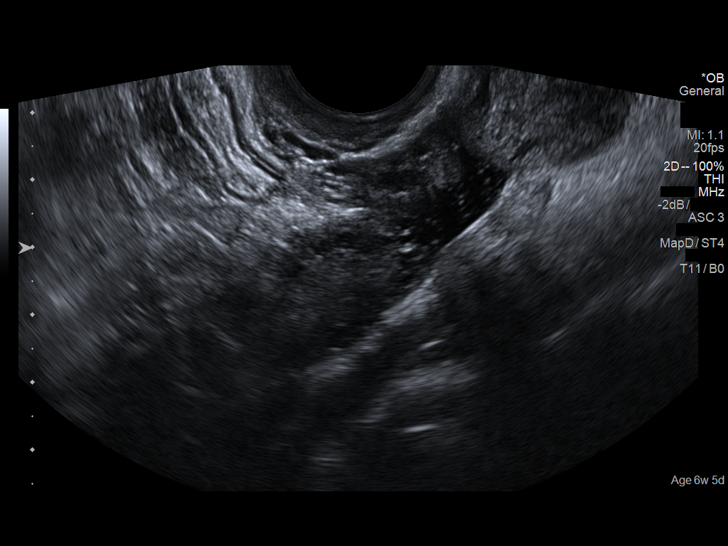
[im 97/114]
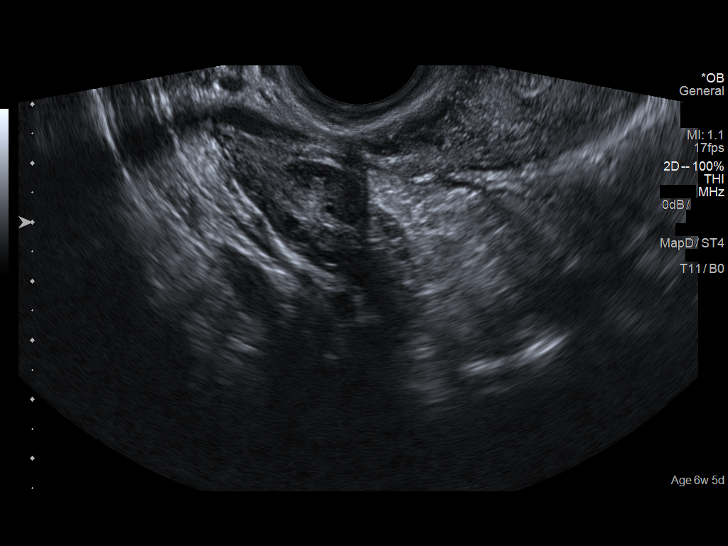
[im 105/114]
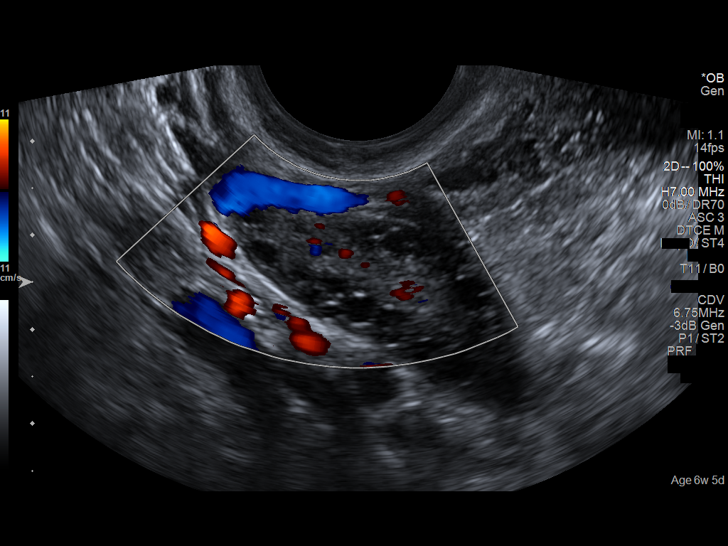
[im 114/114]
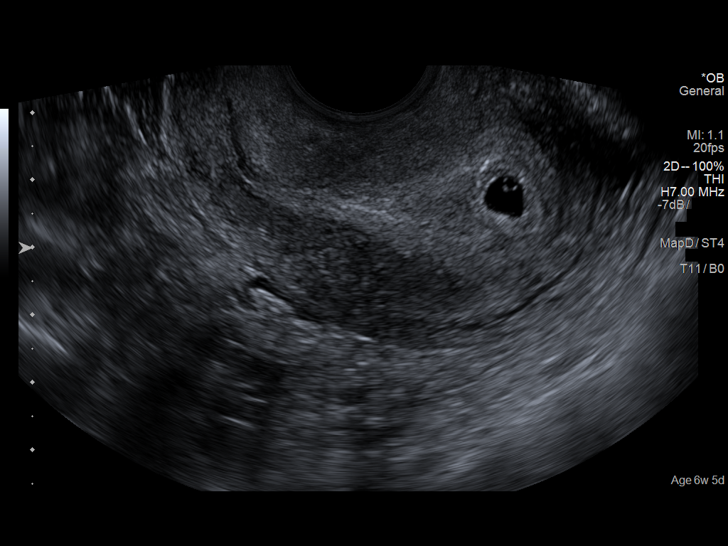

[14 of 28 positions shown; findings below may reference images not displayed]

FINDINGS: Intrauterine gestational sac: Single.

Yolk sac:  Visualized.

Embryo:  Not visualized.

Cardiac Activity: Not applicable.

MSD: 6.4 mm   5 w   2 d      US EDC:

Subchorionic hemorrhage:  None visualized.

Maternal uterus/adnexae: Corpus luteum cyst on the left is noted.
Ovaries are otherwise unremarkable. There is a small volume of
complex pelvic fluid.
IMPRESSION: Intrauterine gestational sac and yolk sac are visualized. Failure to
visualize an embryo is likely due to early gestational age.
Recommend follow-up quantitative HCG. Repeat ultrasound as
clinically indicated.

Small volume of complex appearing pelvic fluid of unknown etiology.

## 2019-09-02 IMAGING — DX DG ABDOMEN 1V
1 series · 1 of 1 positions shown · non-contrast
Comparison: None.

CLINICAL DATA: Post op lap cholecystectomy with nausea and no bowel
movement

EXAM:
ABDOMEN - 1 VIEW

[abdomen kub]
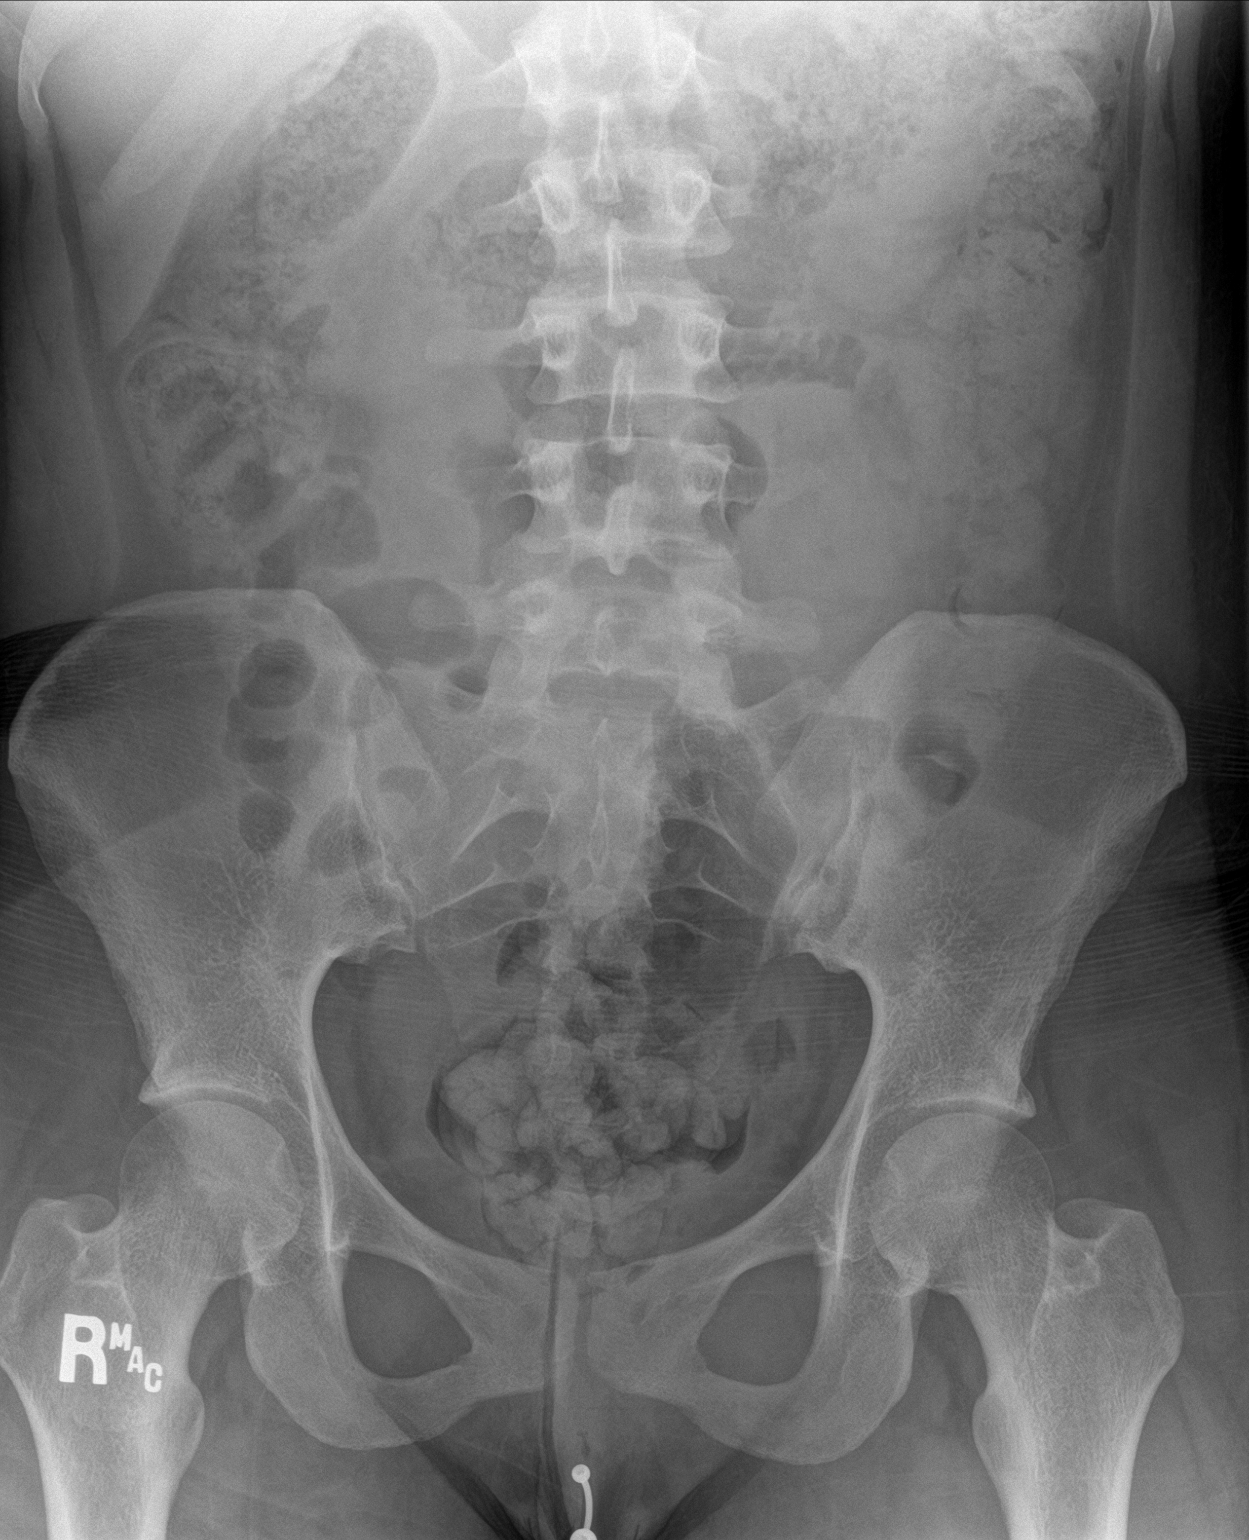

[1 of 1 positions shown; findings below may reference images not displayed]

FINDINGS: Nonobstructed gas pattern with moderate to large amount of stool in
the colon. Moderate formed stool in the rectum. No abnormal
calcification
IMPRESSION: Nonobstructed gas pattern with moderate to large amount of stool in
the colon

## 2020-06-04 ENCOUNTER — Emergency Department (HOSPITAL_BASED_OUTPATIENT_CLINIC_OR_DEPARTMENT_OTHER)
Admission: EM | Admit: 2020-06-04 | Discharge: 2020-06-04 | Disposition: A | Discharge status: Home / Self Care | Payer: Medicaid - Out of State | Attending: Emergency Medicine | Admitting: Emergency Medicine

## 2020-06-04 ENCOUNTER — Other Ambulatory Visit: Payer: Self-pay

## 2020-06-04 ENCOUNTER — Encounter (HOSPITAL_BASED_OUTPATIENT_CLINIC_OR_DEPARTMENT_OTHER): Payer: Self-pay | Admitting: Emergency Medicine

## 2020-06-04 DIAGNOSIS — J029 Acute pharyngitis, unspecified: Secondary | ICD-10-CM

## 2020-06-04 DIAGNOSIS — R070 Pain in throat: Secondary | ICD-10-CM | POA: Diagnosis present

## 2020-06-04 LAB — GROUP A STREP BY PCR: Group A Strep by PCR: NOT DETECTED

## 2020-06-04 MED ORDER — DEXAMETHASONE SODIUM PHOSPHATE 10 MG/ML IJ SOLN
10.0000 mg | Freq: Once | INTRAMUSCULAR | Status: AC
  Administered 2020-06-04: 10 mg via INTRAMUSCULAR
  Filled 2020-06-04: qty 1

## 2020-06-04 MED ORDER — PREDNISONE 10 MG PO TABS
20.0000 mg | ORAL_TABLET | Freq: Every day | ORAL | 0 refills | Status: AC
Start: 2020-06-04 — End: 2020-06-09

## 2020-06-04 NOTE — ED Triage Notes (Signed)
Sore throat x2 days.  Sts the swelling is so much not that she can't eat.  Today she started tasting pus.  Also body aches.

## 2020-06-04 NOTE — ED Provider Notes (Signed)
[facility] EMERGENCY DEPARTMENT Provider Note   CSN: [id number] Arrival date & time: 06/04/20  1014     History Chief Complaint  Patient presents with  . Sore Throat    Cassidy Kirk is a 24 y.o. female presents to the ED for evaluation of sore throat for the last 2 days.  Reports constant, moderate pain that is worse with swallowing.  This morning she woke up and tasted "pus".  States she had a lot of swelling but it made it very hard to swallow or eat.  Has done salt water gargles and states the swelling has improved.  No fever.  No congestion.  No cough.  No vomiting or diarrhea.  No body aches but states she has some chills.  No changes in sense of taste or smell.  Not vaccinated for Covid.  Denies any sick contacts with strep or COVID-19.  HPI     Past Medical History:  Diagnosis Date  . Anemia   . Anemia   . H/O bladder problems     Patient Active Problem List   Diagnosis Date Noted  . Sports physical 05/02/2013    Past Surgical History:  Procedure Laterality Date  . CHOLECYSTECTOMY       OB History    Gravida  1   Para      Term      Preterm      AB      Living        SAB      TAB      Ectopic      Multiple      Live Births              Family History  Problem Relation Age of Onset  . Heart attack Father 83  . Sudden death Neg Hx     Social History   Tobacco Use  . Smoking status: Never Smoker  . Smokeless tobacco: Never Used  Substance Use Topics  . Alcohol use: Not Currently    Comment: occasional  . Drug use: Yes    Types: Marijuana    Home Medications Prior to Admission medications   Medication Sig Start Date End Date Taking? Authorizing Provider  predniSONE (DELTASONE) 10 MG tablet Take 2 tablets (20 mg total) by mouth daily for 5 days. 06/04/20 06/09/20  Liberty Handy, PA-C    Allergies    Patient has no known allergies.  Review of Systems   Review of Systems  HENT: Positive for sore throat and  trouble swallowing.   All other systems reviewed and are negative.   Physical Exam Updated Vital Signs BP 135/83 (BP Location: Left Arm)   Pulse 71   Temp 99.8 F (37.7 C) (Oral)   Resp 16   Ht 5\' 7"  (1.702 m)   Wt 91.2 kg   LMP 05/20/2020   SpO2 100%   Breastfeeding Unknown   BMI 31.50 kg/m   Physical Exam Vitals and nursing note reviewed.  Constitutional:      General: She is not in acute distress.    Appearance: She is well-developed.     Comments: NAD.  HENT:     Head: Normocephalic and atraumatic.     Right Ear: External ear normal.     Left Ear: External ear normal.     Nose: Nose normal.     Mouth/Throat:     Pharynx: Oropharyngeal exudate and posterior oropharyngeal erythema present.     Tonsils: 2+  on the right. 2+ on the left.     Comments: 2+ symmetric tonsillar hypertrophy with exudates.  Uvula midline.  Soft palate and tonsillar pillars flat without asymmetry.  Normal phonation without potato voice.  Tolerating secretions.  Oropharynx with erythema.  Normal sublingual space, tongue protrusion.  No stridor. Eyes:     General: No scleral icterus.    Conjunctiva/sclera: Conjunctivae normal.  Neck:     Comments: No cervical lymphadenopathy.  Trachea midline.  No asymmetric edema on the anterior lateral neck. Cardiovascular:     Rate and Rhythm: Normal rate and regular rhythm.     Heart sounds: Normal heart sounds. No murmur heard.   Pulmonary:     Effort: Pulmonary effort is normal.     Breath sounds: Normal breath sounds. No wheezing.  Musculoskeletal:        General: No deformity. Normal range of motion.     Cervical back: Normal range of motion and neck supple.  Skin:    General: Skin is warm and dry.     Capillary Refill: Capillary refill takes less than 2 seconds.  Neurological:     Mental Status: She is alert and oriented to person, place, and time.  Psychiatric:        Behavior: Behavior normal.        Thought Content: Thought content normal.         Judgment: Judgment normal.     ED Results / Procedures / Treatments   Labs (all labs ordered are listed, but only abnormal results are displayed) Labs Reviewed  GROUP A STREP BY PCR  SARS CORONAVIRUS 2 BY RT PCR (HOSPITAL ORDER, PERFORMED IN [facility])    EKG None  Radiology No results found.  Procedures Procedures (including critical care time)  Medications Ordered in ED Medications  dexamethasone (DECADRON) injection 10 mg (10 mg Intramuscular Given 06/04/20 1130)    ED Course  I have reviewed the triage vital signs and the nursing notes.  Pertinent labs & imaging results that were available during my care of the patient were reviewed by me and considered in my medical decision making (see chart for details).    MDM Rules/Calculators/A&P                          24 year old otherwise healthy female presents with sore throat.  Exam shows symmetric tonsillar hypertrophy with exudates.  No red flags like fever, tonsillar asymmetry, uvular deviation, hot potato voice, trismus, drooling.  Doubt epiglottitis.  No signs or symptoms to raise suspicion for deep neck space infection, RPA, tonsillar abscess, Ludwig's.  Strep test ordered by triage RN, this is negative.  Given current pandemic, I recommended Covid test.  Patient declined because she did not want the nasopharyngeal swab.  States she will go to [facility] to do the other type of test.  I have given her Decadron IM here.  Will discharge with prednisone, high-dose NSAIDs, warm liquids.  I discussed symptoms would warrant immediate return to the ED.  She is in agreement.  Final Clinical Impression(s) / ED Diagnoses Final diagnoses:  Pharyngitis, unspecified etiology    Rx / DC Orders ED Discharge Orders         Ordered    predniSONE (DELTASONE) 10 MG tablet  Daily     Discontinue  Reprint     06/04/20 1135           Liberty Handy, PA-C 06/04/20 1143  Terrilee Files, MD 06/04/20  2040

## 2020-06-04 NOTE — Discharge Instructions (Signed)
You were seen in the ED for sore throat  Strep test is negative  You refused Covid test  I think you need to be tested for Covid given current pandemic and new sore throat.  Seek Covid testing soon as possible.  Until you have a result please isolate and use or wear a mask at all times.  Your sore throat is likely from a virus.  This is treated with anti-inflammatories, warm liquids.  I have given you a prescription for prednisone that you should take for the next 5 days.  This helps with inflammation in the throat.  For pain and inflammation you can use a combination of ibuprofen and acetaminophen.  Take [phone] mg acetaminophen (tylenol) every 6 hours or 600 mg ibuprofen (advil, motrin) every 6 hours.  You can take these separately or combine them every 6 hours for maximum pain control. Do not exceed 4,000 mg acetaminophen or 2,400 mg ibuprofen in a 24 hour period.  Do not take ibuprofen containing products if you have history of kidney disease, ulcers, GI bleeding, severe acid reflux, or take a blood thinner.  Do not take acetaminophen if you have liver disease.   Drink warm/hot liquids as much as possible to help with inflammation.  You may do warm water and salt gargles as well.  Any throat infection can worsen and lead to an abscess or deep infection.  Return to the ED for fevers, worsening pain in the throat, swelling in the throat, changes in your voice, drooling, shortness of breath, neck stiffness

## 2020-06-04 NOTE — ED Notes (Signed)
Pt refused Covid test 

## 2022-10-31 ENCOUNTER — Encounter (HOSPITAL_BASED_OUTPATIENT_CLINIC_OR_DEPARTMENT_OTHER): Payer: Self-pay | Admitting: Emergency Medicine

## 2022-10-31 ENCOUNTER — Other Ambulatory Visit: Payer: Self-pay

## 2022-10-31 ENCOUNTER — Emergency Department (HOSPITAL_BASED_OUTPATIENT_CLINIC_OR_DEPARTMENT_OTHER)
Admission: EM | Admit: 2022-10-31 | Discharge: 2022-10-31 | Disposition: A | Discharge status: Home / Self Care | Payer: Medicaid Other | Attending: Emergency Medicine | Admitting: Emergency Medicine

## 2022-10-31 DIAGNOSIS — K5900 Constipation, unspecified: Secondary | ICD-10-CM | POA: Diagnosis not present

## 2022-10-31 LAB — CBC WITH DIFFERENTIAL/PLATELET
Abs Immature Granulocytes: 0.04 10*3/uL (ref 0.00–0.07)
Basophils Absolute: 0 10*3/uL (ref 0.0–0.1)
Basophils Relative: 0 %
Eosinophils Absolute: 0.3 10*3/uL (ref 0.0–0.5)
Eosinophils Relative: 2 %
HCT: 32.3 % — ABNORMAL LOW (ref 36.0–46.0)
Hemoglobin: 9.2 g/dL — ABNORMAL LOW (ref 12.0–15.0)
Immature Granulocytes: 0 %
Lymphocytes Relative: 31 %
Lymphs Abs: 3.6 10*3/uL (ref 0.7–4.0)
MCH: 19.7 pg — ABNORMAL LOW (ref 26.0–34.0)
MCHC: 28.5 g/dL — ABNORMAL LOW (ref 30.0–36.0)
MCV: 69.2 fL — ABNORMAL LOW (ref 80.0–100.0)
Monocytes Absolute: 0.6 10*3/uL (ref 0.1–1.0)
Monocytes Relative: 5 %
Neutro Abs: 7.1 10*3/uL (ref 1.7–7.7)
Neutrophils Relative %: 62 %
Platelets: 381 10*3/uL (ref 150–400)
RBC: 4.67 MIL/uL (ref 3.87–5.11)
RDW: 21.5 % — ABNORMAL HIGH (ref 11.5–15.5)
Smear Review: NORMAL
WBC: 11.7 10*3/uL — ABNORMAL HIGH (ref 4.0–10.5)
nRBC: 0 % (ref 0.0–0.2)

## 2022-10-31 LAB — COMPREHENSIVE METABOLIC PANEL
ALT: 12 U/L (ref 0–44)
AST: 20 U/L (ref 15–41)
Albumin: 3.8 g/dL (ref 3.5–5.0)
Alkaline Phosphatase: 52 U/L (ref 38–126)
Anion gap: 7 (ref 5–15)
BUN: 7 mg/dL (ref 6–20)
CO2: 23 mmol/L (ref 22–32)
Calcium: 8.7 mg/dL — ABNORMAL LOW (ref 8.9–10.3)
Chloride: 109 mmol/L (ref 98–111)
Creatinine, Ser: 0.7 mg/dL (ref 0.44–1.00)
GFR, Estimated: >60 mL/min (ref >60)
Glucose, Bld: 97 mg/dL (ref 70–99)
Potassium: 3.7 mmol/L (ref 3.5–5.1)
Sodium: 139 mmol/L (ref 135–145)
Total Bilirubin: 0.5 mg/dL (ref 0.3–1.2)
Total Protein: 7.8 g/dL (ref 6.5–8.1)

## 2022-10-31 LAB — LIPASE, BLOOD: Lipase: 31 U/L (ref 11–51)

## 2022-10-31 MED ORDER — POLYETHYLENE GLYCOL 3350 17 G PO PACK: 17.0000 g | PACK | Freq: Two times a day (BID) | ORAL | 0 refills | Status: AC

## 2022-10-31 MED ORDER — LIDOCAINE HCL URETHRAL/MUCOSAL 2 % EX GEL
1.0000 | Freq: Once | CUTANEOUS | Status: AC
  Administered 2022-10-31: 1 via TOPICAL
  Filled 2022-10-31: qty 11

## 2022-10-31 MED ORDER — FLEET ENEMA 7-19 GM/118ML RE ENEM
1.0000 | ENEMA | Freq: Once | RECTAL | Status: AC
  Administered 2022-10-31: 1 via RECTAL
  Filled 2022-10-31: qty 1

## 2022-10-31 NOTE — Discharge Instructions (Signed)
Take miralax twice daily for a week   Stay hydrated and drink plenty of fluids  See your doctor for follow-up.  Return to ER if you have no bowel movements for a week, severe rectal pain, vomiting

## 2022-10-31 NOTE — ED Triage Notes (Signed)
Pt reports constipation since this past Monday

## 2022-10-31 NOTE — ED Provider Notes (Signed)
[place] EMERGENCY DEPARTMENT Provider Note   CSN: [id number] Arrival date & time: 10/31/22  1957     History  Chief Complaint  Patient presents with   Constipation    Cassidy Kirk is a 26 y.o. female here presenting with constipation.  Patient has been constipated for the last 7 days.  Patient tried Dulcolax with no relief.  Patient also tried prune juice and other diet modifications.  Patient has no vomiting.  She has no history of constipation or previous bowel surgery.  The history is provided by the patient.       Home Medications Prior to Admission medications   Not on File      Allergies    Patient has no known allergies.    Review of Systems   Review of Systems  Gastrointestinal:  Positive for constipation.  All other systems reviewed and are negative.   Physical Exam Updated Vital Signs BP 122/60   Pulse 70   Temp 98.3 F (36.8 C)   Resp 18   Ht 5\' 8"  (1.727 m)   Wt 93 kg   LMP 10/27/2022   SpO2 100%   BMI 31.17 kg/m  Physical Exam Vitals and nursing note reviewed.  Constitutional:      Comments: Uncomfortable   HENT:     Nose: Nose normal.     Mouth/Throat:     Mouth: Mucous membranes are moist.  Eyes:     Extraocular Movements: Extraocular movements intact.     Pupils: Pupils are equal, round, and reactive to light.  Cardiovascular:     Rate and Rhythm: Normal rate and regular rhythm.     Pulses: Normal pulses.     Heart sounds: Normal heart sounds.  Pulmonary:     Effort: Pulmonary effort is normal.     Breath sounds: Normal breath sounds.  Abdominal:     General: Abdomen is flat.     Palpations: Abdomen is soft.  Genitourinary:    Comments: Rectal-stool impaction Musculoskeletal:     Cervical back: Normal range of motion and neck supple.  Skin:    General: Skin is warm.     Capillary Refill: Capillary refill takes less than 2 seconds.  Neurological:     General: No focal deficit present.     Mental Status: She  is alert and oriented to person, place, and time.  Psychiatric:        Mood and Affect: Mood normal.        Behavior: Behavior normal.     ED Results / Procedures / Treatments   Labs (all labs ordered are listed, but only abnormal results are displayed) Labs Reviewed  CBC WITH DIFFERENTIAL/PLATELET - Abnormal; Notable for the following components:      Result Value   WBC 11.7 (*)    Hemoglobin 9.2 (*)    HCT 32.3 (*)    MCV 69.2 (*)    MCH 19.7 (*)    MCHC 28.5 (*)    RDW 21.5 (*)    All other components within normal limits  COMPREHENSIVE METABOLIC PANEL - Abnormal; Notable for the following components:   Calcium 8.7 (*)    All other components within normal limits  LIPASE, BLOOD  URINALYSIS, ROUTINE W REFLEX MICROSCOPIC  PREGNANCY, URINE    EKG None  Radiology No results found.  Procedures Fecal disimpaction  Date/Time: 10/31/2022 11:13 PM  Performed by: Drenda Freeze, MD Authorized by: Drenda Freeze, MD  Consent: Verbal consent obtained.  Consent given by: patient Required items: required blood products, implants, devices, and special equipment available Patient identity confirmed: verbally with patient Preparation: Patient was prepped and draped in the usual sterile fashion. Patient tolerance: patient tolerated the procedure well with no immediate complications         Medications Ordered in ED Medications  sodium phosphate (FLEET) 7-19 GM/118ML enema 1 enema (has no administration in time range)  lidocaine (XYLOCAINE) 2 % jelly 1 Application (has no administration in time range)  lidocaine (XYLOCAINE) 2 % jelly 1 Application (has no administration in time range)    ED Course/ Medical Decision Making/ A&P                           Medical Decision Making Cassidy Kirk is a 26 y.o. female here presenting with constipation.  Patient has stool impaction on exam.  I was able to pass a 22 [place] Foley into the rectum after numbing with Uro-Jet.   I was able to place enema through the 22 [place] Foley.  Will also check CBC and CMP as well.  11:12 PM Electrolytes unremarkable.  After I removed the catheter, patient still has a stool ball.  I was able to perform manual disimpaction.  Patient then had large bowel movement.  Will put her on MiraLAX BID for a week.    Problems Addressed: Constipation, unspecified constipation type: acute illness or injury  Amount and/or Complexity of Data Reviewed Labs: ordered. Decision-making details documented in ED Course.  Risk OTC drugs.    Final Clinical Impression(s) / ED Diagnoses Final diagnoses:  None    Rx / DC Orders ED Discharge Orders     None         Charlynne Pander, MD 10/31/22 2323

## 2022-10-31 NOTE — ED Notes (Signed)
Assisted with enema, patient instructed to hold it as long as possible.
